# Patient Record
Sex: Female | Born: 1975 | Race: Black or African American | Hispanic: No | Marital: Married | Smoking: Never smoker
Health system: Southern US, Community
[De-identification: ages and names within clinical notes are randomized; demographics above are authoritative.]

## PROBLEM LIST (undated history)

## (undated) DIAGNOSIS — M329 Systemic lupus erythematosus, unspecified: Secondary | ICD-10-CM

## (undated) DIAGNOSIS — N921 Excessive and frequent menstruation with irregular cycle: Secondary | ICD-10-CM

## (undated) DIAGNOSIS — D510 Vitamin B12 deficiency anemia due to intrinsic factor deficiency: Secondary | ICD-10-CM

## (undated) DIAGNOSIS — K909 Intestinal malabsorption, unspecified: Secondary | ICD-10-CM

## (undated) DIAGNOSIS — F429 Obsessive-compulsive disorder, unspecified: Secondary | ICD-10-CM

## (undated) DIAGNOSIS — F419 Anxiety disorder, unspecified: Secondary | ICD-10-CM

## (undated) DIAGNOSIS — D500 Iron deficiency anemia secondary to blood loss (chronic): Secondary | ICD-10-CM

## (undated) HISTORY — DX: Obsessive-compulsive disorder, unspecified: F42.9

## (undated) HISTORY — DX: Intestinal malabsorption, unspecified: K90.9

## (undated) HISTORY — PX: KNEE SURGERY: SHX244

## (undated) HISTORY — DX: Excessive and frequent menstruation with irregular cycle: N92.1

## (undated) HISTORY — DX: Vitamin B12 deficiency anemia due to intrinsic factor deficiency: D51.0

## (undated) HISTORY — DX: Anxiety disorder, unspecified: F41.9

## (undated) HISTORY — DX: Iron deficiency anemia secondary to blood loss (chronic): D50.0

---

## 1997-12-12 ENCOUNTER — Emergency Department (HOSPITAL_COMMUNITY): Admission: EM | Admit: 1997-12-12 | Discharge: 1997-12-12 | Payer: Self-pay | Admitting: Emergency Medicine

## 1998-03-02 ENCOUNTER — Emergency Department (HOSPITAL_COMMUNITY): Admission: EM | Admit: 1998-03-02 | Discharge: 1998-03-02 | Payer: Self-pay | Admitting: Emergency Medicine

## 1998-08-13 ENCOUNTER — Emergency Department (HOSPITAL_COMMUNITY): Admission: EM | Admit: 1998-08-13 | Discharge: 1998-08-14 | Payer: Self-pay | Admitting: Emergency Medicine

## 1999-02-17 ENCOUNTER — Emergency Department (HOSPITAL_COMMUNITY): Admission: EM | Admit: 1999-02-17 | Discharge: 1999-02-17 | Payer: Self-pay | Admitting: Internal Medicine

## 1999-10-14 ENCOUNTER — Emergency Department (HOSPITAL_COMMUNITY): Admission: EM | Admit: 1999-10-14 | Discharge: 1999-10-14 | Payer: Self-pay | Admitting: Emergency Medicine

## 2003-01-02 ENCOUNTER — Emergency Department (HOSPITAL_COMMUNITY): Admission: EM | Admit: 2003-01-02 | Discharge: 2003-01-03 | Payer: Self-pay | Admitting: Emergency Medicine

## 2004-07-26 ENCOUNTER — Emergency Department (HOSPITAL_COMMUNITY): Admission: EM | Admit: 2004-07-26 | Discharge: 2004-07-26 | Payer: Self-pay | Admitting: Emergency Medicine

## 2017-02-03 ENCOUNTER — Emergency Department (HOSPITAL_COMMUNITY)
Admission: EM | Admit: 2017-02-03 | Discharge: 2017-02-04 | Disposition: A | Discharge status: Home / Self Care | Payer: Self-pay | Attending: Emergency Medicine | Admitting: Emergency Medicine

## 2017-02-03 ENCOUNTER — Encounter (HOSPITAL_COMMUNITY): Payer: Self-pay | Admitting: Emergency Medicine

## 2017-02-03 DIAGNOSIS — R110 Nausea: Secondary | ICD-10-CM | POA: Insufficient documentation

## 2017-02-03 DIAGNOSIS — R51 Headache: Secondary | ICD-10-CM | POA: Insufficient documentation

## 2017-02-03 DIAGNOSIS — K0889 Other specified disorders of teeth and supporting structures: Secondary | ICD-10-CM | POA: Insufficient documentation

## 2017-02-03 HISTORY — DX: Systemic lupus erythematosus, unspecified: M32.9

## 2017-02-03 MED ORDER — ONDANSETRON 4 MG PO TBDP: 4.0000 mg | ORAL_TABLET | Freq: Three times a day (TID) | ORAL | 0 refills | Status: DC | PRN

## 2017-02-03 MED ORDER — NAPROXEN 500 MG PO TABS: 500.0000 mg | ORAL_TABLET | Freq: Two times a day (BID) | ORAL | 0 refills | Status: DC

## 2017-02-03 MED ORDER — OXYCODONE-ACETAMINOPHEN 5-325 MG PO TABS
1.0000 | ORAL_TABLET | Freq: Once | ORAL | Status: AC
  Administered 2017-02-03: 1 via ORAL
  Filled 2017-02-03: qty 1

## 2017-02-03 MED ORDER — PENICILLIN V POTASSIUM 500 MG PO TABS: 500.0000 mg | ORAL_TABLET | Freq: Four times a day (QID) | ORAL | 0 refills | Status: AC

## 2017-02-03 MED ORDER — NAPROXEN 500 MG PO TABS
500.0000 mg | ORAL_TABLET | Freq: Once | ORAL | Status: AC
  Administered 2017-02-03: 500 mg via ORAL
  Filled 2017-02-03: qty 1

## 2017-02-03 MED ORDER — ONDANSETRON 4 MG PO TBDP
4.0000 mg | ORAL_TABLET | Freq: Once | ORAL | Status: AC
Start: 2017-02-03 — End: 2017-02-03
  Administered 2017-02-03: 4 mg via ORAL
  Filled 2017-02-03: qty 1

## 2017-02-03 NOTE — ED Triage Notes (Signed)
Pt states that she has had L upper dental pain since Monday. Alert and oriented.

## 2017-02-03 NOTE — Discharge Instructions (Signed)
Take naproxen twice daily for pain. Start antibiotics immediately. Follow-up with dentistry for formal evaluation. If you have difficulty swallowing, increasing facial swelling or any new or worsening symptoms you should be reevaluated.

## 2017-02-03 NOTE — ED Provider Notes (Signed)
[facility]-EMERGENCY DEPT Provider Note   CSN: [id number] Arrival date & time: 02/03/17  1845  By signing my name below, I, Diona BrownerJennifer Gorman, attest that this documentation has been prepared under the direction and in the presence of Horton, Mayer Maskerourtney F, MD. Electronically Signed: Diona BrownerJennifer Gorman, ED Scribe. 02/03/17. 11:30 PM.  History   Chief Complaint Chief Complaint  Patient presents with  . Dental Pain    HPI Paula Sherman is a 41 y.o. female who presents to the Emergency Department complaining of gradually worsening, severe, left upper dental pain since Monday, 02/01/17. Associated sx include nausea, left sided HA. She has taken ibuprofen and tension HA medication with mild to no relief. Had dental infection in the past. No allergies to medication. Pt denies fever, vomiting, or any other sx at this time.Denies difficulty swallowing or recent dental procedure.  The history is provided by the patient. No language interpreter was used.    Past Medical History:  Diagnosis Date  . Lupus     There are no active problems to display for this patient.   Past Surgical History:  Procedure Laterality Date  . KNEE SURGERY Right     OB History    No data available       Home Medications    Prior to Admission medications   Medication Sig Start Date End Date Taking? Authorizing Provider  naproxen (NAPROSYN) 500 MG tablet Take 1 tablet (500 mg total) by mouth 2 (two) times daily. 02/03/17   Horton, Mayer Maskerourtney F, MD  ondansetron (ZOFRAN ODT) 4 MG disintegrating tablet Take 1 tablet (4 mg total) by mouth every 8 (eight) hours as needed for nausea or vomiting. 02/03/17   Horton, Mayer Maskerourtney F, MD  penicillin v potassium (VEETID) 500 MG tablet Take 1 tablet (500 mg total) by mouth 4 (four) times daily. 02/03/17 02/10/17  Shon BatonHorton, Courtney F, MD    Family History No family history on file.  Social History Social History  Substance Use Topics  . Smoking status: Not on file  . Smokeless  tobacco: Not on file  . Alcohol use Not on file     Allergies   Patient has no known allergies.   Review of Systems Review of Systems  Constitutional: Negative for fever.  HENT: Positive for dental problem.   Gastrointestinal: Positive for nausea. Negative for vomiting.  Musculoskeletal: Positive for neck pain.  Neurological: Positive for headaches.  All other systems reviewed and are negative.    Physical Exam Updated Vital Signs BP (!) 170/121 (BP Location: Left Arm)   Pulse (!) 102   Temp 97.8 F (36.6 C) (Oral)   Resp 18   LMP 01/27/2017   SpO2 92%   Physical Exam  Constitutional: She is oriented to person, place, and time. She appears well-developed and well-nourished.  Uncomfortable appearing, no acute distress  HENT:  Head: Normocephalic and atraumatic.  No trismus, tenderness to palpation along the gumline on the left upper premolar, no palpable abscess, no significant facial swelling, no fullness noted under the tongue  Eyes: Pupils are equal, round, and reactive to light.  Neck: Neck supple.  Cardiovascular: Normal rate, regular rhythm and normal heart sounds.   No murmur heard. Pulmonary/Chest: Effort normal and breath sounds normal. No respiratory distress. She has no wheezes.  Lymphadenopathy:    She has no cervical adenopathy.  Neurological: She is alert and oriented to person, place, and time.  Skin: Skin is warm and dry.  Psychiatric: She has a normal mood and  affect.  Nursing note and vitals reviewed.    ED Treatments / Results  DIAGNOSTIC STUDIES: Oxygen Saturation is 92% on RA, low by my interpretation.   COORDINATION OF CARE: 11:30 PM-Discussed next steps with pt which includes taking pain medication, antibiotics, nausea medication and following up with a Dentist. Pt verbalized understanding and is agreeable with the plan.   Labs (all labs ordered are listed, but only abnormal results are displayed) Labs Reviewed - No data to  display  EKG  EKG Interpretation None       Radiology No results found.  Procedures Procedures (including critical care time)  Medications Ordered in ED Medications  ondansetron (ZOFRAN-ODT) disintegrating tablet 4 mg (not administered)  oxyCODONE-acetaminophen (PERCOCET/ROXICET) 5-325 MG per tablet 1 tablet (not administered)  naproxen (NAPROSYN) tablet 500 mg (not administered)     Initial Impression / Assessment and Plan / ED Course  I have reviewed the triage vital signs and the nursing notes.  Pertinent labs & imaging results that were available during my care of the patient were reviewed by me and considered in my medical decision making (see chart for details).     Patient presents with left sided dental pain and headache. Exam is fairly unremarkable. No drainable abscess.  She has no signs of complications or Lud[place]igs.  Discussed with patient symptom control. She was given Percocet, naproxen, and Zofran here. She'll be discharged with naproxen and penicillin. Dental resources provided. She was instructed that she will need to see dentistry for definitive care.  After history, exam, and medical workup I feel the patient has been appropriately medically screened and is safe for discharge home. Pertinent diagnoses were discussed with the patient. Patient was given return precautions.   Final Clinical Impressions(s) / ED Diagnoses   Final diagnoses:  Pain, dental    New Prescriptions New Prescriptions   NAPROXEN (NAPROSYN) 500 MG TABLET    Take 1 tablet (500 mg total) by mouth 2 (two) times daily.   ONDANSETRON (ZOFRAN ODT) 4 MG DISINTEGRATING TABLET    Take 1 tablet (4 mg total) by mouth every 8 (eight) hours as needed for nausea or vomiting.   PENICILLIN V POTASSIUM (VEETID) 500 MG TABLET    Take 1 tablet (500 mg total) by mouth 4 (four) times daily.   I personally performed the services described in this documentation, which was scribed in my presence. The  recorded information has been reviewed and is accurate.     Shon BatonHorton, Courtney F, MD 02/03/17 [phone]

## 2017-02-04 NOTE — ED Notes (Signed)
Pt ambulatory and independent at discharge.  Verbalized understanding of discharge instructions 

## 2017-10-25 ENCOUNTER — Emergency Department (HOSPITAL_COMMUNITY)
Admission: EM | Admit: 2017-10-25 | Discharge: 2017-10-26 | Disposition: A | Discharge status: Home / Self Care | Payer: Self-pay | Attending: Emergency Medicine | Admitting: Emergency Medicine

## 2017-10-25 ENCOUNTER — Encounter (HOSPITAL_COMMUNITY): Payer: Self-pay | Admitting: Emergency Medicine

## 2017-10-25 ENCOUNTER — Other Ambulatory Visit: Payer: Self-pay

## 2017-10-25 DIAGNOSIS — R1084 Generalized abdominal pain: Secondary | ICD-10-CM

## 2017-10-25 DIAGNOSIS — K529 Noninfective gastroenteritis and colitis, unspecified: Secondary | ICD-10-CM | POA: Insufficient documentation

## 2017-10-25 DIAGNOSIS — R110 Nausea: Secondary | ICD-10-CM

## 2017-10-25 LAB — CBC
HEMATOCRIT: 30.9 % — AB (ref 36.0–46.0)
Hemoglobin: 8.3 g/dL — ABNORMAL LOW (ref 12.0–15.0)
MCH: 16.9 pg — AB (ref 26.0–34.0)
MCHC: 26.9 g/dL — ABNORMAL LOW (ref 30.0–36.0)
MCV: 63.1 fL — AB (ref 78.0–100.0)
PLATELETS: 491 10*3/uL — AB (ref 150–400)
RBC: 4.9 MIL/uL (ref 3.87–5.11)
RDW: 17.5 % — ABNORMAL HIGH (ref 11.5–15.5)
WBC: 6.5 10*3/uL (ref 4.0–10.5)

## 2017-10-25 LAB — COMPREHENSIVE METABOLIC PANEL
ALT: 16 U/L (ref 14–54)
AST: 20 U/L (ref 15–41)
Albumin: 3.9 g/dL (ref 3.5–5.0)
Alkaline Phosphatase: 68 U/L (ref 38–126)
Anion gap: 9 (ref 5–15)
BUN: 7 mg/dL (ref 6–20)
CHLORIDE: 105 mmol/L (ref 101–111)
CO2: 20 mmol/L — AB (ref 22–32)
CREATININE: 0.71 mg/dL (ref 0.44–1.00)
Calcium: 9.1 mg/dL (ref 8.9–10.3)
GFR calc non Af Amer: >60 mL/min (ref >60)
Glucose, Bld: 134 mg/dL — ABNORMAL HIGH (ref 65–99)
POTASSIUM: 3.8 mmol/L (ref 3.5–5.1)
SODIUM: 134 mmol/L — AB (ref 135–145)
Total Bilirubin: 0.9 mg/dL (ref 0.3–1.2)
Total Protein: 8.9 g/dL — ABNORMAL HIGH (ref 6.5–8.1)

## 2017-10-25 LAB — LIPASE, BLOOD: LIPASE: 26 U/L (ref 11–51)

## 2017-10-25 LAB — I-STAT BETA HCG BLOOD, ED (MC, WL, AP ONLY): I-stat hCG, quantitative: <5 m[IU]/mL (ref <5)

## 2017-10-25 NOTE — ED Triage Notes (Addendum)
Pt from home via ems with c/o lower abdominal pain and cramping that radiates up her back. Pt is sensitive to palpation mostly on rt side. LMP ws March 17. Pt denies being sexually active. Pt reports smaller than normal bm x 2 days. Pt reports nausea with no emesis to EMS but reports 4 episodes of emesis to this RN.

## 2017-10-25 NOTE — ED Notes (Signed)
PT BEGAN C/O MID STERNAL CHESTPAIN DURING BLOOD DRAW RN NOTIFIED EKG WILL BE PERFORMED

## 2017-10-26 ENCOUNTER — Encounter (HOSPITAL_COMMUNITY): Payer: Self-pay

## 2017-10-26 ENCOUNTER — Other Ambulatory Visit: Payer: Self-pay

## 2017-10-26 ENCOUNTER — Emergency Department (HOSPITAL_COMMUNITY): Payer: Self-pay

## 2017-10-26 LAB — URINALYSIS, ROUTINE W REFLEX MICROSCOPIC
Bilirubin Urine: NEGATIVE
Glucose, UA: NEGATIVE mg/dL
Ketones, ur: 20 mg/dL — AB
Leukocytes, UA: NEGATIVE
Nitrite: POSITIVE — AB
PH: 5 (ref 5.0–8.0)
Protein, ur: 30 mg/dL — AB
SPECIFIC GRAVITY, URINE: 1.033 — AB (ref 1.005–1.030)

## 2017-10-26 MED ORDER — FENTANYL CITRATE (PF) 100 MCG/2ML IJ SOLN
50.0000 ug | Freq: Once | INTRAMUSCULAR | Status: AC
  Administered 2017-10-26: 50 ug via INTRAVENOUS
  Filled 2017-10-26: qty 2

## 2017-10-26 MED ORDER — ONDANSETRON HCL 4 MG/2ML IJ SOLN
4.0000 mg | Freq: Once | INTRAMUSCULAR | Status: AC | PRN
  Administered 2017-10-26: 4 mg via INTRAVENOUS
  Filled 2017-10-26: qty 2

## 2017-10-26 MED ORDER — DICYCLOMINE HCL 20 MG PO TABS: 20.0000 mg | ORAL_TABLET | Freq: Two times a day (BID) | ORAL | 0 refills | Status: DC

## 2017-10-26 MED ORDER — MORPHINE SULFATE (PF) 4 MG/ML IV SOLN
4.0000 mg | Freq: Once | INTRAVENOUS | Status: AC
  Administered 2017-10-26: 4 mg via INTRAVENOUS
  Filled 2017-10-26: qty 1

## 2017-10-26 MED ORDER — METOCLOPRAMIDE HCL 10 MG PO TABS: 10.0000 mg | ORAL_TABLET | Freq: Four times a day (QID) | ORAL | 0 refills | Status: DC

## 2017-10-26 MED ORDER — IOPAMIDOL (ISOVUE-300) INJECTION 61%
INTRAVENOUS | Status: AC
  Filled 2017-10-26: qty 100

## 2017-10-26 MED ORDER — IOPAMIDOL (ISOVUE-300) INJECTION 61%
100.0000 mL | Freq: Once | INTRAVENOUS | Status: AC | PRN
  Administered 2017-10-26: 100 mL via INTRAVENOUS

## 2017-10-26 MED ORDER — METOCLOPRAMIDE HCL 5 MG/ML IJ SOLN
10.0000 mg | Freq: Once | INTRAMUSCULAR | Status: AC
  Administered 2017-10-26: 10 mg via INTRAVENOUS
  Filled 2017-10-26: qty 2

## 2017-10-26 NOTE — ED Provider Notes (Signed)
[facility]-EMERGENCY DEPT Provider Note   CSN: [id number] Arrival date & time: 10/25/17  2125     History   Chief Complaint Chief Complaint  Patient presents with  . Abdominal Pain    HPI Paula Sherman is a 42 y.o. female.  Patient presents for evaluation of abdominal pain associated with nausea. No vomiting. Pain is cramping, sharp, located in the suprapubic region and moves upward along midline to periumbilical area. There is radiation to the back. The pain prevents her from being able to sit straight up because this makes it worse. No fever. No urinary symptoms or diarrhea. She reports increased pain with need to have a BM. No blood per rectum. She reports she started having chest pain after arrival to the hospital. She tried taking Tylenol for pain without relief.   The history is provided by the patient and the spouse. No language interpreter was used.    Past Medical History:  Diagnosis Date  . Lupus (HCC)     There are no active problems to display for this patient.   Past Surgical History:  Procedure Laterality Date  . KNEE SURGERY Right      OB History   None      Home Medications    Prior to Admission medications   Medication Sig Start Date End Date Taking? Authorizing Provider  naproxen (NAPROSYN) 500 MG tablet Take 1 tablet (500 mg total) by mouth 2 (two) times daily. Patient not taking: Reported on 10/26/2017 02/03/17   Horton, Mayer Maskerourtney F, MD  ondansetron (ZOFRAN ODT) 4 MG disintegrating tablet Take 1 tablet (4 mg total) by mouth every 8 (eight) hours as needed for nausea or vomiting. Patient not taking: Reported on 10/26/2017 02/03/17   Horton, Mayer Maskerourtney F, MD    Family History No family history on file.  Social History Social History   Tobacco Use  . Smoking status: Unknown If Ever Smoked  Substance Use Topics  . Alcohol use: Not Currently    Frequency: Never  . Drug use: Not Currently     Allergies     Penicillins   Review of Systems Review of Systems  Constitutional: Negative for chills and fever.  HENT: Negative.   Respiratory: Negative.  Negative for shortness of breath.   Cardiovascular: Positive for chest pain (After arrival to hospital).  Gastrointestinal: Positive for abdominal pain and nausea. Negative for blood in stool and diarrhea.  Genitourinary: Negative.   Musculoskeletal: Negative.   Skin: Negative.   Neurological: Negative.      Physical Exam Updated Vital Signs BP (!) 154/90 (BP Location: Left Arm)   Pulse 100   Temp 98.1 F (36.7 C) (Oral)   Resp (!) 27   Ht 5\' 7"  (1.702 m)   Wt 90.7 kg (200 lb)   LMP 09/26/2017 Comment: negative beta HCG 10/25/17  SpO2 100%   BMI 31.32 kg/m   Physical Exam  Constitutional: She appears well-developed and well-nourished.  Uncomfortable appearing.  HENT:  Head: Normocephalic.  Neck: Normal range of motion. Neck supple.  Cardiovascular: Normal rate and regular rhythm.  No murmur heard. Pulmonary/Chest: Effort normal and breath sounds normal.  Abdominal: Soft. Bowel sounds are normal. There is tenderness (Diffuse tenderness that is worse in the suprapubic and periumbilical abdomen.) in the periumbilical area and suprapubic area. There is no rebound and no guarding.  Musculoskeletal: Normal range of motion.  Neurological: She is alert. No cranial nerve deficit.  Skin: Skin is warm and dry. No  rash noted.  Psychiatric: She has a normal mood and affect.     ED Treatments / Results  Labs (all labs ordered are listed, but only abnormal results are displayed) Labs Reviewed  COMPREHENSIVE METABOLIC PANEL - Abnormal; Notable for the following components:      Result Value   Sodium 134 (*)    CO2 20 (*)    Glucose, Bld 134 (*)    Total Protein 8.9 (*)    All other components within normal limits  CBC - Abnormal; Notable for the following components:   Hemoglobin 8.3 (*)    HCT 30.9 (*)    MCV 63.1 (*)    MCH 16.9  (*)    MCHC 26.9 (*)    RDW 17.5 (*)    Platelets 491 (*)    All other components within normal limits  LIPASE, BLOOD  URINALYSIS, ROUTINE W REFLEX MICROSCOPIC  I-STAT BETA HCG BLOOD, ED ([facility], [facility], [facility] ONLY)   Results for orders placed or performed during the hospital encounter of 10/25/17  Lipase, blood  Result Value Ref Range   Lipase 26 11 - 51 U/L  Comprehensive metabolic panel  Result Value Ref Range   Sodium 134 (L) 135 - 145 mmol/L   Potassium 3.8 3.5 - 5.1 mmol/L   Chloride 105 101 - 111 mmol/L   CO2 20 (L) 22 - 32 mmol/L   Glucose, Bld 134 (H) 65 - 99 mg/dL   BUN 7 6 - 20 mg/dL   Creatinine, Ser 1.61 0.44 - 1.00 mg/dL   Calcium 9.1 8.9 - 09.6 mg/dL   Total Protein 8.9 (H) 6.5 - 8.1 g/dL   Albumin 3.9 3.5 - 5.0 g/dL   AST 20 15 - 41 U/L   ALT 16 14 - 54 U/L   Alkaline Phosphatase 68 38 - 126 U/L   Total Bilirubin 0.9 0.3 - 1.2 mg/dL   GFR calc non Af Amer >60 >60 mL/min   GFR calc Af Amer >60 >60 mL/min   Anion gap 9 5 - 15  CBC  Result Value Ref Range   WBC 6.5 4.0 - 10.5 K/uL   RBC 4.90 3.87 - 5.11 MIL/uL   Hemoglobin 8.3 (L) 12.0 - 15.0 g/dL   HCT 04.5 (L) 40.9 - 81.1 %   MCV 63.1 (L) 78.0 - 100.0 fL   MCH 16.9 (L) 26.0 - 34.0 pg   MCHC 26.9 (L) 30.0 - 36.0 g/dL   RDW 91.4 (H) 78.2 - 95.6 %   Platelets 491 (H) 150 - 400 K/uL  Urinalysis, Routine w reflex microscopic  Result Value Ref Range   Color, Urine AMBER (A) YELLOW   APPearance CLEAR CLEAR   Specific Gravity, Urine 1.033 (H) 1.005 - 1.030   pH 5.0 5.0 - 8.0   Glucose, UA NEGATIVE NEGATIVE mg/dL   Hgb urine dipstick SMALL (A) NEGATIVE   Bilirubin Urine NEGATIVE NEGATIVE   Ketones, ur 20 (A) NEGATIVE mg/dL   Protein, ur 30 (A) NEGATIVE mg/dL   Nitrite POSITIVE (A) NEGATIVE   Leukocytes, UA NEGATIVE NEGATIVE   RBC / HPF 0-5 0 - 5 RBC/hpf   WBC, UA 0-5 0 - 5 WBC/hpf   Bacteria, UA MANY (A) NONE SEEN   Squamous Epithelial / LPF 0-5 (A) NONE SEEN   Mucus PRESENT   I-Stat beta hCG blood, ED  Result  Value Ref Range   I-stat hCG, quantitative <5.0 <5 mIU/mL   Comment 3  EKG None  Radiology No results found. Ct Abdomen Pelvis W Contrast  Result Date: 10/26/2017 CLINICAL DATA:  Acute onset of lower abdominal pain and nausea. EXAM: CT ABDOMEN AND PELVIS WITH CONTRAST TECHNIQUE: Multidetector CT imaging of the abdomen and pelvis was performed using the standard protocol following bolus administration of intravenous contrast. CONTRAST:  ISOVUE-300 IOPAMIDOL (ISOVUE-300) INJECTION 61% COMPARISON:  None. FINDINGS: Lower chest: Minimal scarring is noted at the left lung base. The visualized portions of the mediastinum are unremarkable. Hepatobiliary: The liver is unremarkable in appearance. The gallbladder is unremarkable. The common bile duct remains normal in caliber. Pancreas: The pancreas is within normal limits. Spleen: The spleen is unremarkable in appearance. Adrenals/Urinary Tract: The adrenal glands are unremarkable in appearance. The kidneys are within normal limits. There is no evidence of hydronephrosis. No renal or ureteral stones are identified. No perinephric stranding is seen. Stomach/Bowel: There is distention of small-bowel loops to 3.7 cm in diameter, with wall thickening along a relatively long segment of proximal to mid ileum at right mid abdomen, reflecting infectious or inflammatory ileitis. Mild underlying mesenteric edema is noted. There is no definite evidence for bowel obstruction. Vascular/Lymphatic: The abdominal aorta is unremarkable in appearance. The inferior vena cava is grossly unremarkable. No retroperitoneal lymphadenopathy is seen. No pelvic sidewall lymphadenopathy is identified. Reproductive: The bladder is mildly distended and grossly unremarkable. Several uterine fibroids are noted. The uterus is otherwise unremarkable. The ovaries are grossly symmetric. No suspicious masses are seen. Other: A small amount of ascites is noted tracking about the liver  and spleen, and along the left paracolic gutter. A small amount of free fluid is noted within the pelvis. Musculoskeletal: No acute osseous abnormalities are identified. The visualized musculature is unremarkable in appearance. IMPRESSION: 1. Distention of small-bowel loops to 3.7 cm in diameter, with wall thickening along a relatively long segment of proximal to mid ileum at the right mid abdomen, reflecting infectious or inflammatory ileitis with underlying dysmotility. No definite evidence for bowel obstruction. 2. Associated small amount of ascites within the abdomen and pelvis. 3. Uterine fibroids noted. Electronically Signed   By: Roanna Raider M.D.   On: 10/26/2017 05:46    Procedures Procedures (including critical care time)  Medications Ordered in ED Medications  iopamidol (ISOVUE-300) 61 % injection (has no administration in time range)  ondansetron (ZOFRAN) injection 4 mg (4 mg Intravenous Given 10/26/17 0217)  fentaNYL (SUBLIMAZE) injection 50 mcg (50 mcg Intravenous Given 10/26/17 0351)  morphine 4 MG/ML injection 4 mg (4 mg Intravenous Given 10/26/17 0439)  metoCLOPramide (REGLAN) injection 10 mg (10 mg Intravenous Given 10/26/17 0437)  iopamidol (ISOVUE-300) 61 % injection 100 mL (100 mLs Intravenous Contrast Given 10/26/17 0507)     Initial Impression / Assessment and Plan / ED Course  I have reviewed the triage vital signs and the nursing notes.  Pertinent labs & imaging results that were available during my care of the patient were reviewed by me and considered in my medical decision making (see chart for details).     Patient having lower abdominal to periumbilical abdominal pain, nausea. No fever.   She appears uncomfortable on arrival, better with pain medications. No vomiting. Labs are essentially unremarkable.   Hgb 8.3. Hemoccult card collected but is lost in system and no result obtainable. Discussed recollection with the patient who refuses. She denies melena or  hematemesis. She reports history of heavy periods but is not aware of history of anemia. No history of transfusion. She denies  urinary symptoms. UA shows bacteria but minimal WBC's. Doubt this is contributory.  CT scan shows bowel wall inflammation c/w ileitis. No SBO observed definitively. She reports passing stool and persistent flatulence. Doubt obstruction.   Results reviewed and discussed with Dr. Bebe Shaggy. No fever or leukocytosis. Can be treated supportively, without need for antibiotics. Stressed close follow up with any changing symptoms or new concerns.   Patient's pain is improved with medications. VSS. She is felt appropriate for discharge home.   Final Clinical Impressions(s) / ED Diagnoses   Final diagnoses:  None   1. Abdominal pain 2. Ileitis  ED Discharge Orders    None       Elpidio Anis, Cordelia Poche 10/28/17 [id number]    Zadie Rhine, MD 10/28/17 [phone]

## 2017-10-26 NOTE — ED Notes (Signed)
Patient transported to CT 

## 2017-10-26 NOTE — ED Notes (Signed)
Pt made aware that Urine Sample needed. Pt states she is unable to void. Will try again later.

## 2017-10-26 NOTE — ED Notes (Signed)
Pt reports that pain has mildly decreased and nausea has returned after having intermittent relief with initial dose of Zofran. Provider to be notified.

## 2017-10-26 NOTE — Discharge Instructions (Signed)
Return here with any worsening symptoms or new concern.

## 2018-09-07 ENCOUNTER — Encounter: Payer: Self-pay | Admitting: Family Medicine

## 2018-09-07 ENCOUNTER — Other Ambulatory Visit: Payer: Self-pay | Admitting: Family Medicine

## 2018-09-07 ENCOUNTER — Ambulatory Visit: Payer: 59 | Admitting: Family Medicine

## 2018-09-07 ENCOUNTER — Telehealth: Payer: Self-pay | Admitting: Family Medicine

## 2018-09-07 VITALS — BP 118/70 | HR 68 | Temp 98.0°F | Ht 67.5 in | Wt 194.0 lb

## 2018-09-07 DIAGNOSIS — Z114 Encounter for screening for human immunodeficiency virus [HIV]: Secondary | ICD-10-CM

## 2018-09-07 DIAGNOSIS — Z13220 Encounter for screening for lipoid disorders: Secondary | ICD-10-CM | POA: Diagnosis not present

## 2018-09-07 DIAGNOSIS — Z131 Encounter for screening for diabetes mellitus: Secondary | ICD-10-CM | POA: Diagnosis not present

## 2018-09-07 DIAGNOSIS — N920 Excessive and frequent menstruation with regular cycle: Secondary | ICD-10-CM

## 2018-09-07 DIAGNOSIS — Z1211 Encounter for screening for malignant neoplasm of colon: Secondary | ICD-10-CM

## 2018-09-07 DIAGNOSIS — D649 Anemia, unspecified: Secondary | ICD-10-CM

## 2018-09-07 DIAGNOSIS — M2550 Pain in unspecified joint: Secondary | ICD-10-CM | POA: Diagnosis not present

## 2018-09-07 DIAGNOSIS — R5383 Other fatigue: Secondary | ICD-10-CM | POA: Diagnosis not present

## 2018-09-07 DIAGNOSIS — R059 Cough, unspecified: Secondary | ICD-10-CM

## 2018-09-07 DIAGNOSIS — Z800 Family history of malignant neoplasm of digestive organs: Secondary | ICD-10-CM

## 2018-09-07 DIAGNOSIS — Z1239 Encounter for other screening for malignant neoplasm of breast: Secondary | ICD-10-CM

## 2018-09-07 DIAGNOSIS — M329 Systemic lupus erythematosus, unspecified: Secondary | ICD-10-CM | POA: Diagnosis not present

## 2018-09-07 DIAGNOSIS — R05 Cough: Secondary | ICD-10-CM

## 2018-09-07 LAB — CBC WITH DIFFERENTIAL/PLATELET
Basophils Absolute: 0 10*3/uL (ref 0.0–0.1)
Basophils Relative: 0.8 % (ref 0.0–3.0)
Eosinophils Absolute: 0.1 10*3/uL (ref 0.0–0.7)
Eosinophils Relative: 2.2 % (ref 0.0–5.0)
HCT: 25.5 % — ABNORMAL LOW (ref 36.0–46.0)
Hemoglobin: 7.5 g/dL — CL (ref 12.0–15.0)
Lymphocytes Relative: 44.3 % (ref 12.0–46.0)
Lymphs Abs: 1.2 10*3/uL (ref 0.7–4.0)
MCHC: 29.2 g/dL — AB (ref 30.0–36.0)
MCV: 59.8 fl — ABNORMAL LOW (ref 78.0–100.0)
Monocytes Absolute: 0.3 10*3/uL (ref 0.1–1.0)
Monocytes Relative: 10.1 % (ref 3.0–12.0)
Neutro Abs: 1.2 10*3/uL — ABNORMAL LOW (ref 1.4–7.7)
Neutrophils Relative %: 42.6 % — ABNORMAL LOW (ref 43.0–77.0)
Platelets: 420 10*3/uL — ABNORMAL HIGH (ref 150.0–400.0)
RBC: 4.27 Mil/uL (ref 3.87–5.11)
RDW: 18.6 % — ABNORMAL HIGH (ref 11.5–15.5)
WBC: 2.7 10*3/uL — ABNORMAL LOW (ref 4.0–10.5)

## 2018-09-07 LAB — COMPREHENSIVE METABOLIC PANEL
ALT: 8 U/L (ref 0–35)
AST: 16 U/L (ref 0–37)
Albumin: 4.2 g/dL (ref 3.5–5.2)
Alkaline Phosphatase: 74 U/L (ref 39–117)
BUN: 7 mg/dL (ref 6–23)
CO2: 25 mEq/L (ref 19–32)
Calcium: 9 mg/dL (ref 8.4–10.5)
Chloride: 102 mEq/L (ref 96–112)
Creatinine, Ser: 0.83 mg/dL (ref 0.40–1.20)
GFR: 90.25 mL/min (ref >60.00)
GLUCOSE: 81 mg/dL (ref 70–99)
Potassium: 3.4 mEq/L — ABNORMAL LOW (ref 3.5–5.1)
Sodium: 137 mEq/L (ref 135–145)
Total Bilirubin: 0.9 mg/dL (ref 0.2–1.2)
Total Protein: 8.2 g/dL (ref 6.0–8.3)

## 2018-09-07 LAB — LIPID PANEL
Cholesterol: 170 mg/dL (ref 0–200)
HDL: 34.7 mg/dL — ABNORMAL LOW (ref >39.00)
LDL Cholesterol: 120 mg/dL — ABNORMAL HIGH (ref 0–99)
NonHDL: 135.57
Total CHOL/HDL Ratio: 5
Triglycerides: 78 mg/dL (ref 0.0–149.0)
VLDL: 15.6 mg/dL (ref 0.0–40.0)

## 2018-09-07 LAB — C-REACTIVE PROTEIN

## 2018-09-07 LAB — SEDIMENTATION RATE: Sed Rate: 122 mm/hr — ABNORMAL HIGH (ref 0–20)
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
## 2018-09-07 NOTE — Telephone Encounter (Signed)
I received a fasx regarding critical lab results.  *hemoglobin 7.5*  Long standing hx of anemia per patient. States that she had some evaluation over 20 years ago. There was question of sickle cell at that time? States she didn't get definite answer.   Periods are "awful". Has heavy bleeding first 2-3 days with clots. They are regular. Have been this way for years.   She denies dizziness, shortness of breath, fatigue.  He is okay with referrals to hematology for her significant anemia which I had suggested as well as gynecology to discuss her heavy periods.   I will see if we are able to add on some iron studies to blood work from today.

## 2018-09-08 ENCOUNTER — Other Ambulatory Visit (INDEPENDENT_AMBULATORY_CARE_PROVIDER_SITE_OTHER): Payer: 59

## 2018-09-08 ENCOUNTER — Encounter: Payer: Self-pay | Admitting: Family Medicine

## 2018-09-08 DIAGNOSIS — D649 Anemia, unspecified: Secondary | ICD-10-CM | POA: Diagnosis not present

## 2018-09-08 LAB — IBC + FERRITIN
FERRITIN: 3.5 ng/mL — AB (ref 10.0–291.0)
Iron: 16 ug/dL — ABNORMAL LOW (ref 42–145)
Saturation Ratios: 3.9 % — ABNORMAL LOW (ref 20.0–50.0)
Transferrin: 296 mg/dL (ref 212.0–360.0)

## 2018-09-08 LAB — TSH: TSH: 1.51 u[IU]/mL (ref 0.35–4.50)

## 2018-09-08 NOTE — Telephone Encounter (Signed)
Lab has been added. 

## 2018-09-09 ENCOUNTER — Telehealth: Payer: Self-pay | Admitting: Hematology & Oncology

## 2018-09-09 LAB — HIV ANTIBODY (ROUTINE TESTING W REFLEX): HIV: NONREACTIVE

## 2018-09-09 LAB — ANA: Anti Nuclear Antibody(ANA): POSITIVE — AB

## 2018-09-09 LAB — ANTI-NUCLEAR AB-TITER (ANA TITER): ANA Titer 1: 1:160 {titer} — ABNORMAL HIGH

## 2018-09-09 LAB — RHEUMATOID FACTOR: Rhuematoid fact SerPl-aCnc: <14 IU/mL (ref <14)

## 2018-09-09 NOTE — Telephone Encounter (Signed)
Spoke with patient to confirm new patient appt 3/2 at 130 pm. Pt preferred Dr Myna Hidalgo bc he treated her mom.

## 2018-09-12 ENCOUNTER — Encounter: Payer: Self-pay | Admitting: Hematology & Oncology

## 2018-09-12 ENCOUNTER — Inpatient Hospital Stay: Payer: 59

## 2018-09-12 ENCOUNTER — Other Ambulatory Visit: Payer: Self-pay

## 2018-09-12 ENCOUNTER — Inpatient Hospital Stay: Payer: 59 | Attending: Hematology & Oncology | Admitting: Hematology & Oncology

## 2018-09-12 VITALS — BP 144/87 | HR 94 | Temp 98.4°F | Resp 18 | Wt 196.5 lb

## 2018-09-12 DIAGNOSIS — D510 Vitamin B12 deficiency anemia due to intrinsic factor deficiency: Secondary | ICD-10-CM

## 2018-09-12 DIAGNOSIS — K909 Intestinal malabsorption, unspecified: Secondary | ICD-10-CM | POA: Insufficient documentation

## 2018-09-12 DIAGNOSIS — D500 Iron deficiency anemia secondary to blood loss (chronic): Secondary | ICD-10-CM

## 2018-09-12 DIAGNOSIS — N921 Excessive and frequent menstruation with irregular cycle: Secondary | ICD-10-CM | POA: Diagnosis not present

## 2018-09-12 DIAGNOSIS — N951 Menopausal and female climacteric states: Secondary | ICD-10-CM | POA: Insufficient documentation

## 2018-09-12 DIAGNOSIS — R61 Generalized hyperhidrosis: Secondary | ICD-10-CM | POA: Insufficient documentation

## 2018-09-12 DIAGNOSIS — E538 Deficiency of other specified B group vitamins: Secondary | ICD-10-CM | POA: Insufficient documentation

## 2018-09-12 DIAGNOSIS — D509 Iron deficiency anemia, unspecified: Secondary | ICD-10-CM | POA: Diagnosis present

## 2018-09-12 HISTORY — DX: Iron deficiency anemia secondary to blood loss (chronic): D50.0

## 2018-09-12 HISTORY — DX: Intestinal malabsorption, unspecified: K90.9

## 2018-09-12 HISTORY — DX: Vitamin B12 deficiency anemia due to intrinsic factor deficiency: D51.0

## 2018-09-12 HISTORY — DX: Excessive and frequent menstruation with irregular cycle: N92.1

## 2018-09-12 LAB — CMP (CANCER CENTER ONLY)
ALT: 7 U/L (ref 0–44)
AST: 14 U/L — ABNORMAL LOW (ref 15–41)
Albumin: 4.3 g/dL (ref 3.5–5.0)
Alkaline Phosphatase: 65 U/L (ref 38–126)
Anion gap: 9 (ref 5–15)
BUN: 9 mg/dL (ref 6–20)
CO2: 26 mmol/L (ref 22–32)
Calcium: 9.5 mg/dL (ref 8.9–10.3)
Chloride: 100 mmol/L (ref 98–111)
Creatinine: 0.81 mg/dL (ref 0.44–1.00)
GFR, Est AFR Am: >60 mL/min (ref >60)
GFR, Estimated: >60 mL/min (ref >60)
Glucose, Bld: 145 mg/dL — ABNORMAL HIGH (ref 70–99)
Potassium: 3.1 mmol/L — ABNORMAL LOW (ref 3.5–5.1)
Sodium: 135 mmol/L (ref 135–145)
Total Bilirubin: 0.8 mg/dL (ref 0.3–1.2)
Total Protein: 8.4 g/dL — ABNORMAL HIGH (ref 6.5–8.1)

## 2018-09-12 LAB — CBC WITH DIFFERENTIAL (CANCER CENTER ONLY)
Abs Immature Granulocytes: 0.01 10*3/uL (ref 0.00–0.07)
Basophils Absolute: 0 10*3/uL (ref 0.0–0.1)
Basophils Relative: 1 %
Eosinophils Absolute: 0.1 10*3/uL (ref 0.0–0.5)
Eosinophils Relative: 2 %
HCT: 27.2 % — ABNORMAL LOW (ref 36.0–46.0)
HEMOGLOBIN: 7.3 g/dL — AB (ref 12.0–15.0)
Immature Granulocytes: 0 %
Lymphocytes Relative: 46 %
Lymphs Abs: 1.5 10*3/uL (ref 0.7–4.0)
MCH: 17.5 pg — ABNORMAL LOW (ref 26.0–34.0)
MCHC: 26.8 g/dL — ABNORMAL LOW (ref 30.0–36.0)
MCV: 65.4 fL — ABNORMAL LOW (ref 80.0–100.0)
MONOS PCT: 7 %
Monocytes Absolute: 0.2 10*3/uL (ref 0.1–1.0)
Neutro Abs: 1.4 10*3/uL — ABNORMAL LOW (ref 1.7–7.7)
Neutrophils Relative %: 44 %
Platelet Count: 420 10*3/uL — ABNORMAL HIGH (ref 150–400)
RBC: 4.16 MIL/uL (ref 3.87–5.11)
RDW: 17.9 % — AB (ref 11.5–15.5)
WBC Count: 3.1 10*3/uL — ABNORMAL LOW (ref 4.0–10.5)
nRBC: 0 % (ref 0.0–0.2)

## 2018-09-12 LAB — RETICULOCYTES
Immature Retic Fract: 18.9 % — ABNORMAL HIGH (ref 2.3–15.9)
RBC.: 4.16 MIL/uL (ref 3.87–5.11)
Retic Count, Absolute: 34.5 10*3/uL (ref 19.0–186.0)
Retic Ct Pct: 0.8 % (ref 0.4–3.1)

## 2018-09-12 LAB — SAVE SMEAR(SSMR), FOR PROVIDER SLIDE REVIEW

## 2018-09-12 LAB — VITAMIN B12: VITAMIN B 12: 108 pg/mL — AB (ref 180–914)

## 2018-09-12 MED ORDER — FOLIC ACID 1 MG PO TABS: 2.0000 mg | ORAL_TABLET | Freq: Every day | ORAL | 3 refills | Status: DC
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
## 2018-09-12 NOTE — Addendum Note (Signed)
Addended by: Arlan Organ R on: 09/12/2018 05:30 PM   Modules accepted: Orders

## 2018-09-13 ENCOUNTER — Telehealth: Payer: Self-pay | Admitting: *Deleted

## 2018-09-13 LAB — IRON AND TIBC
Iron: 13 ug/dL — ABNORMAL LOW (ref 41–142)
Saturation Ratios: 4 % — ABNORMAL LOW (ref 21–57)
TIBC: 358 ug/dL (ref 236–444)
UIBC: 345 ug/dL (ref 120–384)

## 2018-09-13 LAB — FERRITIN

## 2018-09-13 LAB — LACTATE DEHYDROGENASE: LDH: 183 U/L (ref 98–192)

## 2018-09-13 LAB — ERYTHROPOIETIN: Erythropoietin: 209.4 m[IU]/mL — ABNORMAL HIGH (ref 2.6–18.5)

## 2018-09-13 NOTE — Telephone Encounter (Signed)
-----   Message from Josph Macho, MD sent at 09/13/2018 10:56 AM EST ----- Please call and tell her that her iron level is very low.  In addition, her vitamin B12 level is also very low.  She will need both IV iron and B12 injections.  I think she is set up to start this week.

## 2018-09-13 NOTE — Telephone Encounter (Signed)
Patient notified per order of Dr. Myna Hidalgo that her iron and vitamin b12 levels are low and that she will need both IV iron and B12 injections at her next two appts.  Pt appreciative of call and has no questions at this time.

## 2018-09-14 ENCOUNTER — Telehealth: Payer: Self-pay | Admitting: Hematology & Oncology

## 2018-09-14 NOTE — Telephone Encounter (Signed)
Called and talked with patient regarding appointments being added per 3/5 staff message

## 2018-09-15 LAB — HEMOGLOBINOPATHY EVALUATION
Hgb A2 Quant: 1.8 % (ref 1.8–3.2)
Hgb A: 98.2 % (ref 96.4–98.8)
Hgb C: 0 %
Hgb F Quant: 0 % (ref 0.0–2.0)
Hgb S Quant: 0 %
Hgb Variant: 0 %

## 2018-09-16 ENCOUNTER — Other Ambulatory Visit: Payer: Self-pay

## 2018-09-16 ENCOUNTER — Inpatient Hospital Stay: Payer: 59

## 2018-09-16 VITALS — BP 158/96 | HR 100 | Temp 98.2°F | Resp 18

## 2018-09-16 DIAGNOSIS — N921 Excessive and frequent menstruation with irregular cycle: Secondary | ICD-10-CM

## 2018-09-16 DIAGNOSIS — D509 Iron deficiency anemia, unspecified: Secondary | ICD-10-CM | POA: Diagnosis not present

## 2018-09-16 DIAGNOSIS — D500 Iron deficiency anemia secondary to blood loss (chronic): Secondary | ICD-10-CM

## 2018-09-16 DIAGNOSIS — K909 Intestinal malabsorption, unspecified: Secondary | ICD-10-CM

## 2018-09-16 MED ORDER — SODIUM CHLORIDE 0.9 % IV SOLN
Freq: Once | INTRAVENOUS | Status: AC
  Administered 2018-09-16: 11:00:00 via INTRAVENOUS
  Filled 2018-09-16: qty 250

## 2018-09-16 MED ORDER — SODIUM CHLORIDE 0.9 % IV SOLN
510.0000 mg | Freq: Once | INTRAVENOUS | Status: AC
  Administered 2018-09-16: 510 mg via INTRAVENOUS
  Filled 2018-09-16: qty 17

## 2018-09-16 NOTE — Patient Instructions (Signed)

## 2018-09-19 ENCOUNTER — Inpatient Hospital Stay: Payer: 59

## 2018-09-19 ENCOUNTER — Telehealth: Payer: Self-pay | Admitting: Hematology & Oncology

## 2018-09-19 VITALS — BP 156/97 | HR 90 | Temp 99.0°F | Resp 18

## 2018-09-19 DIAGNOSIS — D509 Iron deficiency anemia, unspecified: Secondary | ICD-10-CM | POA: Diagnosis not present

## 2018-09-19 DIAGNOSIS — N921 Excessive and frequent menstruation with irregular cycle: Secondary | ICD-10-CM

## 2018-09-19 DIAGNOSIS — K909 Intestinal malabsorption, unspecified: Secondary | ICD-10-CM

## 2018-09-19 DIAGNOSIS — D500 Iron deficiency anemia secondary to blood loss (chronic): Secondary | ICD-10-CM

## 2018-09-19 MED ORDER — CYANOCOBALAMIN 1000 MCG/ML IJ SOLN
INTRAMUSCULAR | Status: AC
  Filled 2018-09-19: qty 1

## 2018-09-19 MED ORDER — CYANOCOBALAMIN 1000 MCG/ML IJ SOLN
1000.0000 ug | Freq: Once | INTRAMUSCULAR | Status: AC
  Administered 2018-09-19: 1000 ug via INTRAMUSCULAR

## 2018-09-19 NOTE — Patient Instructions (Signed)
Cyanocobalamin, Vitamin B12 injection What is this medicine? CYANOCOBALAMIN (sye an oh koe BAL a min) is a man made form of vitamin B12. Vitamin B12 is used in the growth of healthy blood cells, nerve cells, and proteins in the body. It also helps with the metabolism of fats and carbohydrates. This medicine is used to treat people who can not absorb vitamin B12. This medicine may be used for other purposes; ask your health care provider or pharmacist if you have questions. COMMON BRAND NAME(S): B-12 Compliance Kit, B-12 Injection Kit, Cyomin, LA-12, Nutri-Twelve, Physicians EZ Use B-12, Primabalt What should I tell my health care provider before I take this medicine? They need to know if you have any of these conditions: -kidney disease -Leber's disease -megaloblastic anemia -an unusual or allergic reaction to cyanocobalamin, cobalt, other medicines, foods, dyes, or preservatives -pregnant or trying to get pregnant -breast-feeding How should I use this medicine? This medicine is injected into a muscle or deeply under the skin. It is usually given by a health care professional in a clinic or doctor's office. However, your doctor may teach you how to inject yourself. Follow all instructions. Talk to your pediatrician regarding the use of this medicine in children. Special care may be needed. Overdosage: If you think you have taken too much of this medicine contact a poison control center or emergency room at once. NOTE: This medicine is only for you. Do not share this medicine with others. What if I miss a dose? If you are given your dose at a clinic or doctor's office, call to reschedule your appointment. If you give your own injections and you miss a dose, take it as soon as you can. If it is almost time for your next dose, take only that dose. Do not take double or extra doses. What may interact with this medicine? -colchicine -heavy alcohol intake This list may not describe all possible  interactions. Give your health care provider a list of all the medicines, herbs, non-prescription drugs, or dietary supplements you use. Also tell them if you smoke, drink alcohol, or use illegal drugs. Some items may interact with your medicine. What should I watch for while using this medicine? Visit your doctor or health care professional regularly. You may need blood work done while you are taking this medicine. You may need to follow a special diet. Talk to your doctor. Limit your alcohol intake and avoid smoking to get the best benefit. What side effects may I notice from receiving this medicine? Side effects that you should report to your doctor or health care professional as soon as possible: -allergic reactions like skin rash, itching or hives, swelling of the face, lips, or tongue -blue tint to skin -chest tightness, pain -difficulty breathing, wheezing -dizziness -red, swollen painful area on the leg Side effects that usually do not require medical attention (report to your doctor or health care professional if they continue or are bothersome): -diarrhea -headache This list may not describe all possible side effects. Call your doctor for medical advice about side effects. You may report side effects to FDA at 1-800-FDA-1088. Where should I keep my medicine? Keep out of the reach of children. Store at room temperature between 15 and 30 degrees C (59 and 85 degrees F). Protect from light. Throw away any unused medicine after the expiration date. NOTE: This sheet is a summary. It may not cover all possible information. If you have questions about this medicine, talk to your doctor, pharmacist, or   health care provider.  2019 Elsevier/Gold Standard (2007-10-10 22:10:20)  

## 2018-09-19 NOTE — Telephone Encounter (Signed)
Appointments scheduled calendar printed per per 3/9 staff message

## 2018-09-23 ENCOUNTER — Inpatient Hospital Stay: Payer: 59

## 2018-09-26 ENCOUNTER — Inpatient Hospital Stay: Payer: 59

## 2018-09-26 ENCOUNTER — Other Ambulatory Visit: Payer: Self-pay

## 2018-09-26 VITALS — BP 142/90 | HR 90 | Temp 97.8°F | Resp 18

## 2018-09-26 DIAGNOSIS — K909 Intestinal malabsorption, unspecified: Secondary | ICD-10-CM

## 2018-09-26 DIAGNOSIS — N921 Excessive and frequent menstruation with irregular cycle: Secondary | ICD-10-CM

## 2018-09-26 DIAGNOSIS — D509 Iron deficiency anemia, unspecified: Secondary | ICD-10-CM | POA: Diagnosis not present

## 2018-09-26 DIAGNOSIS — D500 Iron deficiency anemia secondary to blood loss (chronic): Secondary | ICD-10-CM

## 2018-09-26 MED ORDER — CYANOCOBALAMIN 1000 MCG/ML IJ SOLN
1000.0000 ug | Freq: Once | INTRAMUSCULAR | Status: AC
  Administered 2018-09-26: 1000 ug via INTRAMUSCULAR

## 2018-09-26 MED ORDER — CYANOCOBALAMIN 1000 MCG/ML IJ SOLN
INTRAMUSCULAR | Status: AC
  Filled 2018-09-26: qty 1

## 2018-09-26 MED ORDER — SODIUM CHLORIDE 0.9 % IV SOLN
Freq: Once | INTRAVENOUS | Status: AC
  Administered 2018-09-26: 14:00:00 via INTRAVENOUS
  Filled 2018-09-26: qty 250

## 2018-09-26 MED ORDER — SODIUM CHLORIDE 0.9 % IV SOLN
510.0000 mg | Freq: Once | INTRAVENOUS | Status: AC
  Administered 2018-09-26: 510 mg via INTRAVENOUS
  Filled 2018-09-26: qty 17

## 2018-09-26 NOTE — Patient Instructions (Signed)

## 2018-10-03 ENCOUNTER — Inpatient Hospital Stay: Payer: 59

## 2018-10-03 ENCOUNTER — Other Ambulatory Visit: Payer: Self-pay

## 2018-10-03 VITALS — BP 170/100 | HR 78 | Temp 97.8°F | Resp 18

## 2018-10-03 DIAGNOSIS — K909 Intestinal malabsorption, unspecified: Secondary | ICD-10-CM

## 2018-10-03 DIAGNOSIS — D509 Iron deficiency anemia, unspecified: Secondary | ICD-10-CM | POA: Diagnosis not present

## 2018-10-03 DIAGNOSIS — N921 Excessive and frequent menstruation with irregular cycle: Secondary | ICD-10-CM

## 2018-10-03 DIAGNOSIS — D500 Iron deficiency anemia secondary to blood loss (chronic): Secondary | ICD-10-CM

## 2018-10-03 MED ORDER — CYANOCOBALAMIN 1000 MCG/ML IJ SOLN
1000.0000 ug | Freq: Once | INTRAMUSCULAR | Status: AC
  Administered 2018-10-03: 1000 ug via INTRAMUSCULAR

## 2018-10-03 NOTE — Progress Notes (Signed)
Patient blood pressure 170/100. Patient denies visual changes, chest pain or any other concerns or symptoms. Emeline Gins, NP notified. Patient to follow up with her PCP. Patient verbalized understanding.

## 2018-10-03 NOTE — Patient Instructions (Signed)
Cyanocobalamin, Vitamin B12 injection What is this medicine? CYANOCOBALAMIN (sye an oh koe BAL a min) is a man made form of vitamin B12. Vitamin B12 is used in the growth of healthy blood cells, nerve cells, and proteins in the body. It also helps with the metabolism of fats and carbohydrates. This medicine is used to treat people who can not absorb vitamin B12. This medicine may be used for other purposes; ask your health care provider or pharmacist if you have questions. COMMON BRAND NAME(S): B-12 Compliance Kit, B-12 Injection Kit, Cyomin, LA-12, Nutri-Twelve, Physicians EZ Use B-12, Primabalt What should I tell my health care provider before I take this medicine? They need to know if you have any of these conditions: -kidney disease -Leber's disease -megaloblastic anemia -an unusual or allergic reaction to cyanocobalamin, cobalt, other medicines, foods, dyes, or preservatives -pregnant or trying to get pregnant -breast-feeding How should I use this medicine? This medicine is injected into a muscle or deeply under the skin. It is usually given by a health care professional in a clinic or doctor's office. However, your doctor may teach you how to inject yourself. Follow all instructions. Talk to your pediatrician regarding the use of this medicine in children. Special care may be needed. Overdosage: If you think you have taken too much of this medicine contact a poison control center or emergency room at once. NOTE: This medicine is only for you. Do not share this medicine with others. What if I miss a dose? If you are given your dose at a clinic or doctor's office, call to reschedule your appointment. If you give your own injections and you miss a dose, take it as soon as you can. If it is almost time for your next dose, take only that dose. Do not take double or extra doses. What may interact with this medicine? -colchicine -heavy alcohol intake This list may not describe all possible  interactions. Give your health care provider a list of all the medicines, herbs, non-prescription drugs, or dietary supplements you use. Also tell them if you smoke, drink alcohol, or use illegal drugs. Some items may interact with your medicine. What should I watch for while using this medicine? Visit your doctor or health care professional regularly. You may need blood work done while you are taking this medicine. You may need to follow a special diet. Talk to your doctor. Limit your alcohol intake and avoid smoking to get the best benefit. What side effects may I notice from receiving this medicine? Side effects that you should report to your doctor or health care professional as soon as possible: -allergic reactions like skin rash, itching or hives, swelling of the face, lips, or tongue -blue tint to skin -chest tightness, pain -difficulty breathing, wheezing -dizziness -red, swollen painful area on the leg Side effects that usually do not require medical attention (report to your doctor or health care professional if they continue or are bothersome): -diarrhea -headache This list may not describe all possible side effects. Call your doctor for medical advice about side effects. You may report side effects to FDA at 1-800-FDA-1088. Where should I keep my medicine? Keep out of the reach of children. Store at room temperature between 15 and 30 degrees C (59 and 85 degrees F). Protect from light. Throw away any unused medicine after the expiration date. NOTE: This sheet is a summary. It may not cover all possible information. If you have questions about this medicine, talk to your doctor, pharmacist, or  health care provider.  2019 Elsevier/Gold Standard (2007-10-10 22:10:20)

## 2018-10-04 ENCOUNTER — Telehealth: Payer: Self-pay

## 2018-10-04 NOTE — Telephone Encounter (Signed)
Copied from CRM [id number]. Topic: General - Other >> Oct 04, 2018  8:18 AM Lynne Logan D wrote: Reason for CRM: Pt stated she went to her Hematologist on yesterday. They checked her BP which was 172/100. They advised pt to reach out and let her PCP know. Pt did not check BP this morning as she has no way to do so. Declined to speak with NT and stated that other than a slight headache, she felt fine. Please advise.

## 2018-10-05 NOTE — Telephone Encounter (Signed)
Noted patient is aware 

## 2018-10-05 NOTE — Telephone Encounter (Signed)
That sounds ok to me. We had a few good numbers in the system, so I am not worried that she has this sort of elevation all the time. Let us know if any concerns or if she happens to recheck (like at doc visit; please don't use pharmacy cuff) and it it is still elevated then we may need to discuss again.

## 2018-10-05 NOTE — Telephone Encounter (Signed)
Spoke with patient. She stated that she went to [facility] and BP cuffs are out of stock. She also checked [facility] and they are only delivering essential items at this time. She stated she feels fine and that she is not concerned. Patient ws offered a webex appointment and declines appointment at this time.

## 2018-10-05 NOTE — Telephone Encounter (Signed)
Her blood pressure looked quite good when she was here for last visit. Any chance she can order cuff online or purchase at pharmacy so she can check at home. I don't want to over-react on one elevated number, but would like to make sure that this improves. I would recommend arm cuff and she will need to make sure it fits her properly (should be large adult cuff) so we get accurate reading.  I would like to avoid having her coming out in public places to regularly check bp if possible!

## 2018-10-10 ENCOUNTER — Ambulatory Visit: Payer: Self-pay | Admitting: Obstetrics & Gynecology

## 2018-10-11 ENCOUNTER — Inpatient Hospital Stay: Payer: 59

## 2018-10-11 ENCOUNTER — Other Ambulatory Visit: Payer: Self-pay

## 2018-10-11 VITALS — BP 160/95 | HR 80 | Resp 18

## 2018-10-11 DIAGNOSIS — D500 Iron deficiency anemia secondary to blood loss (chronic): Secondary | ICD-10-CM

## 2018-10-11 DIAGNOSIS — N921 Excessive and frequent menstruation with irregular cycle: Secondary | ICD-10-CM

## 2018-10-11 DIAGNOSIS — D509 Iron deficiency anemia, unspecified: Secondary | ICD-10-CM | POA: Diagnosis not present

## 2018-10-11 DIAGNOSIS — K909 Intestinal malabsorption, unspecified: Secondary | ICD-10-CM

## 2018-10-11 MED ORDER — CYANOCOBALAMIN 1000 MCG/ML IJ SOLN
INTRAMUSCULAR | Status: AC
  Filled 2018-10-11: qty 1

## 2018-10-11 MED ORDER — CYANOCOBALAMIN 1000 MCG/ML IJ SOLN
1000.0000 ug | Freq: Once | INTRAMUSCULAR | Status: AC
  Administered 2018-10-11: 1000 ug via INTRAMUSCULAR

## 2018-10-11 NOTE — Patient Instructions (Signed)
Cyanocobalamin, Vitamin B12 injection What is this medicine? CYANOCOBALAMIN (sye an oh koe BAL a min) is a man made form of vitamin B12. Vitamin B12 is used in the growth of healthy blood cells, nerve cells, and proteins in the body. It also helps with the metabolism of fats and carbohydrates. This medicine is used to treat people who can not absorb vitamin B12. This medicine may be used for other purposes; ask your health care provider or pharmacist if you have questions. COMMON BRAND NAME(S): B-12 Compliance Kit, B-12 Injection Kit, Cyomin, LA-12, Nutri-Twelve, Physicians EZ Use B-12, Primabalt What should I tell my health care provider before I take this medicine? They need to know if you have any of these conditions: -kidney disease -Leber's disease -megaloblastic anemia -an unusual or allergic reaction to cyanocobalamin, cobalt, other medicines, foods, dyes, or preservatives -pregnant or trying to get pregnant -breast-feeding How should I use this medicine? This medicine is injected into a muscle or deeply under the skin. It is usually given by a health care professional in a clinic or doctor's office. However, your doctor may teach you how to inject yourself. Follow all instructions. Talk to your pediatrician regarding the use of this medicine in children. Special care may be needed. Overdosage: If you think you have taken too much of this medicine contact a poison control center or emergency room at once. NOTE: This medicine is only for you. Do not share this medicine with others. What if I miss a dose? If you are given your dose at a clinic or doctor's office, call to reschedule your appointment. If you give your own injections and you miss a dose, take it as soon as you can. If it is almost time for your next dose, take only that dose. Do not take double or extra doses. What may interact with this medicine? -colchicine -heavy alcohol intake This list may not describe all possible  interactions. Give your health care provider a list of all the medicines, herbs, non-prescription drugs, or dietary supplements you use. Also tell them if you smoke, drink alcohol, or use illegal drugs. Some items may interact with your medicine. What should I watch for while using this medicine? Visit your doctor or health care professional regularly. You may need blood work done while you are taking this medicine. You may need to follow a special diet. Talk to your doctor. Limit your alcohol intake and avoid smoking to get the best benefit. What side effects may I notice from receiving this medicine? Side effects that you should report to your doctor or health care professional as soon as possible: -allergic reactions like skin rash, itching or hives, swelling of the face, lips, or tongue -blue tint to skin -chest tightness, pain -difficulty breathing, wheezing -dizziness -red, swollen painful area on the leg Side effects that usually do not require medical attention (report to your doctor or health care professional if they continue or are bothersome): -diarrhea -headache This list may not describe all possible side effects. Call your doctor for medical advice about side effects. You may report side effects to FDA at 1-800-FDA-1088. Where should I keep my medicine? Keep out of the reach of children. Store at room temperature between 15 and 30 degrees C (59 and 85 degrees F). Protect from light. Throw away any unused medicine after the expiration date. NOTE: This sheet is a summary. It may not cover all possible information. If you have questions about this medicine, talk to your doctor, pharmacist, or   health care provider.  2019 Elsevier/Gold Standard (2007-10-10 22:10:20)  

## 2018-10-24 ENCOUNTER — Inpatient Hospital Stay: Payer: 59

## 2018-10-24 ENCOUNTER — Inpatient Hospital Stay: Payer: 59 | Attending: Hematology & Oncology | Admitting: Hematology & Oncology

## 2018-10-24 ENCOUNTER — Ambulatory Visit: Payer: 59 | Admitting: Rheumatology

## 2018-10-24 ENCOUNTER — Other Ambulatory Visit: Payer: Self-pay

## 2018-10-24 ENCOUNTER — Encounter: Payer: Self-pay | Admitting: Hematology & Oncology

## 2018-10-24 VITALS — BP 158/100 | HR 93 | Temp 97.7°F | Resp 16 | Wt 199.0 lb

## 2018-10-24 DIAGNOSIS — D509 Iron deficiency anemia, unspecified: Secondary | ICD-10-CM | POA: Insufficient documentation

## 2018-10-24 DIAGNOSIS — D510 Vitamin B12 deficiency anemia due to intrinsic factor deficiency: Secondary | ICD-10-CM

## 2018-10-24 DIAGNOSIS — D500 Iron deficiency anemia secondary to blood loss (chronic): Secondary | ICD-10-CM

## 2018-10-24 DIAGNOSIS — Z79899 Other long term (current) drug therapy: Secondary | ICD-10-CM | POA: Insufficient documentation

## 2018-10-24 LAB — CBC WITH DIFFERENTIAL (CANCER CENTER ONLY)
Abs Immature Granulocytes: 0 10*3/uL (ref 0.00–0.07)
Basophils Absolute: 0 10*3/uL (ref 0.0–0.1)
Basophils Relative: 1 %
Eosinophils Absolute: 0.1 10*3/uL (ref 0.0–0.5)
Eosinophils Relative: 3 %
HCT: 39.1 % (ref 36.0–46.0)
Hemoglobin: 11.9 g/dL — ABNORMAL LOW (ref 12.0–15.0)
Immature Granulocytes: 0 %
Lymphocytes Relative: 47 %
Lymphs Abs: 1.4 10*3/uL (ref 0.7–4.0)
MCH: 24.1 pg — ABNORMAL LOW (ref 26.0–34.0)
MCHC: 30.4 g/dL (ref 30.0–36.0)
MCV: 79.1 fL — ABNORMAL LOW (ref 80.0–100.0)
Monocytes Absolute: 0.3 10*3/uL (ref 0.1–1.0)
Monocytes Relative: 9 %
Neutro Abs: 1.3 10*3/uL — ABNORMAL LOW (ref 1.7–7.7)
Neutrophils Relative %: 40 %
Platelet Count: 312 10*3/uL (ref 150–400)
RBC: 4.94 MIL/uL (ref 3.87–5.11)
WBC Count: 3.1 10*3/uL — ABNORMAL LOW (ref 4.0–10.5)
nRBC: 0 % (ref 0.0–0.2)

## 2018-10-24 LAB — RETICULOCYTES
Immature Retic Fract: 5.9 % (ref 2.3–15.9)
RBC.: 4.89 MIL/uL (ref 3.87–5.11)
Retic Count, Absolute: 30.3 10*3/uL (ref 19.0–186.0)
Retic Ct Pct: 0.6 % (ref 0.4–3.1)

## 2018-10-24 MED ORDER — TELMISARTAN 40 MG PO TABS: 40.0000 mg | ORAL_TABLET | Freq: Every day | ORAL | 4 refills | Status: DC

## 2018-10-24 NOTE — Progress Notes (Signed)
[name]  Paula Sherman [facility] [id number] [birth year] 43 y.o. 10/24/2018   Principle Diagnosis:   Iron deficiency anemia-chronic iron loss  Pernicious anemia  Current Therapy:    IV iron-Feraheme given on 09/26/2018  Vitamin B-12 1000 mcg IM monthly     Interim History:  Paula Sherman is back for follow-up.  This is her second office visit.  We first saw her back in early March.  At that time, she was quite anemic.  Her iron studies showed a ferritin less than 4 with an iron saturation of only 4%.  She had a low B12 level of 108.  She got IV iron.  She started IM vitamin B12.  She has responded incredibly well.  She no longer is chewing ice.  She is very active.  Her family is trying to slow her down now.  She really has responded incredibly well.  Her MCV has come up nicely.  She has had no problems with her monthly cycle.  She has had no change in bowel or bladder habits.  She has had no leg swelling.  She has had no rashes.  Overall, her performance status is ECOG 0.  Medications:  Current Outpatient Medications:  .  folic acid (FOLVITE) 1 MG tablet, Take 2 tablets (2 mg total) by mouth daily., Disp: 180 tablet, Rfl: 3 .  Melatonin 10 MG TABS, Take 10 mg by mouth daily as needed., Disp: , Rfl:   Allergies:  Allergies  Allergen Reactions  . Penicillins Anaphylaxis  . Latex Rash    Past Medical History, Surgical history, Social history, and Family History were reviewed and updated.  Review of Systems: Review of Systems  Constitutional: Negative.   HENT:  Negative.   Eyes: Negative.   Respiratory: Negative.   Cardiovascular: Negative.   Gastrointestinal: Negative.   Endocrine: Negative.   Genitourinary: Negative.    Musculoskeletal: Negative.   Skin: Negative.   Neurological: Negative.   Hematological: Negative.   Psychiatric/Behavioral: Negative.     Physical Exam:  weight is 199 lb (90.3 kg). Her oral temperature is  97.7 F (36.5 C). Her blood pressure is 158/100 (abnormal) and her pulse is 93. Her respiration is 16 and oxygen saturation is 100%.   Wt Readings from Last 3 Encounters:  10/24/18 199 lb (90.3 kg)  09/12/18 196 lb 8 oz (89.1 kg)  09/07/18 194 lb (88 kg)    Physical Exam Vitals signs reviewed.  HENT:     Head: Normocephalic and atraumatic.  Eyes:     Pupils: Pupils are equal, round, and reactive to light.  Neck:     Musculoskeletal: Normal range of motion.  Cardiovascular:     Rate and Rhythm: Normal rate and regular rhythm.     Heart sounds: Normal heart sounds.  Pulmonary:     Effort: Pulmonary effort is normal.     Breath sounds: Normal breath sounds.  Abdominal:     General: Bowel sounds are normal.     Palpations: Abdomen is soft.  Musculoskeletal: Normal range of motion.        General: No tenderness or deformity.  Lymphadenopathy:     Cervical: No cervical adenopathy.  Skin:    General: Skin is warm and dry.     Findings: No erythema or rash.  Neurological:     Mental Status: She is alert and oriented to person, place, and time.  Psychiatric:        Behavior: Behavior normal.  Thought Content: Thought content normal.        Judgment: Judgment normal.      Lab Results  Component Value Date   WBC 3.1 (L) 10/24/2018   HGB 11.9 (L) 10/24/2018   HCT 39.1 10/24/2018   MCV 79.1 (L) 10/24/2018   PLT 312 10/24/2018     Chemistry      Component Value Date/Time   NA 135 09/12/2018 1356   K 3.1 (L) 09/12/2018 1356   CL 100 09/12/2018 1356   CO2 26 09/12/2018 1356   BUN 9 09/12/2018 1356   CREATININE 0.81 09/12/2018 1356      Component Value Date/Time   CALCIUM 9.5 09/12/2018 1356   ALKPHOS 65 09/12/2018 1356   AST 14 (L) 09/12/2018 1356   ALT 7 09/12/2018 1356   BILITOT 0.8 09/12/2018 1356         Impression and Plan: Paula Sherman is a 43 year old African-American female.  She has both iron deficiency anemia and pernicious anemia.   Clearly, both being treated has helped her gain a quick recovery of her blood counts.  Clinically, she is doing so much better.  We will see what her iron levels are.  Her MCV is still a little on the low side.  If she needs another dose of iron, we will certainly give it to her.  She is not due for another vitamin B12 injection until we see her back.  We will have to see what the parietal cell antibodies and the intrinsic factor antibodies show.  I am just happy that her quality of life is doing better.  This is a true blessing.  We will have her come back to see us in another month or so.   Josph MachoPeter R Mayeli Bornhorst, MD 4/13/20202:23 PM

## 2018-10-25 ENCOUNTER — Telehealth: Payer: Self-pay | Admitting: *Deleted

## 2018-10-25 LAB — IRON AND TIBC
Iron: 37 ug/dL — ABNORMAL LOW (ref 41–142)
Saturation Ratios: 16 % — ABNORMAL LOW (ref 21–57)
TIBC: 234 ug/dL — ABNORMAL LOW (ref 236–444)
UIBC: 196 ug/dL (ref 120–384)

## 2018-10-25 LAB — INTRINSIC FACTOR ANTIBODIES: Intrinsic Factor: 1.1 AU/mL (ref 0.0–1.1)

## 2018-10-25 LAB — FERRITIN: Ferritin: 93 ng/mL (ref 11–307)

## 2018-10-25 NOTE — Progress Notes (Signed)
Can you check with her and see if she has ability to check bp at home? I saw recent hematology visit and pressure was pretty elevated. If she can check at home and report back to me in 1-2 weeks her daily readings that would be helpful!

## 2018-10-25 NOTE — Telephone Encounter (Signed)
-----   Message from Josph Macho, MD sent at 10/25/2018 11:58 AM EDT ----- Call - the iron is better but still low.  She needs 1 more does of Feraheme!!  Please set up!!  Cindee Lame

## 2018-10-25 NOTE — Telephone Encounter (Signed)
Notified pt per order of Dr. Myna Hidalgo that the iron is better but still low and that she will need one more dose of Feraheme. Message sent to scheduling.

## 2018-10-26 ENCOUNTER — Ambulatory Visit: Payer: 59 | Admitting: Rheumatology

## 2018-11-02 ENCOUNTER — Inpatient Hospital Stay: Payer: 59

## 2018-11-02 ENCOUNTER — Other Ambulatory Visit: Payer: Self-pay | Admitting: Family

## 2018-11-02 ENCOUNTER — Other Ambulatory Visit: Payer: Self-pay

## 2018-11-02 VITALS — BP 156/106 | Temp 97.8°F

## 2018-11-02 DIAGNOSIS — K909 Intestinal malabsorption, unspecified: Secondary | ICD-10-CM

## 2018-11-02 DIAGNOSIS — N921 Excessive and frequent menstruation with irregular cycle: Secondary | ICD-10-CM

## 2018-11-02 DIAGNOSIS — D500 Iron deficiency anemia secondary to blood loss (chronic): Secondary | ICD-10-CM

## 2018-11-02 DIAGNOSIS — D509 Iron deficiency anemia, unspecified: Secondary | ICD-10-CM | POA: Diagnosis not present

## 2018-11-02 MED ORDER — SODIUM CHLORIDE 0.9 % IV SOLN
510.0000 mg | Freq: Once | INTRAVENOUS | Status: AC
  Administered 2018-11-02: 510 mg via INTRAVENOUS
  Filled 2018-11-02: qty 17

## 2018-11-02 MED ORDER — SODIUM CHLORIDE 0.9 % IV SOLN
Freq: Once | INTRAVENOUS | Status: AC
  Administered 2018-11-02: 13:00:00 via INTRAVENOUS
  Filled 2018-11-02: qty 250

## 2018-11-02 NOTE — Patient Instructions (Signed)
Anemia  Anemia is a condition in which you do not have enough red blood cells or hemoglobin. Hemoglobin is a substance in red blood cells that carries oxygen. When you do not have enough red blood cells or hemoglobin (are anemic), your body cannot get enough oxygen and your organs may not work properly. As a result, you may feel very tired or have other problems. What are the causes? Common causes of anemia include:  Excessive bleeding. Anemia can be caused by excessive bleeding inside or outside the body, including bleeding from the intestine or from periods in women.  Poor nutrition.  Long-lasting (chronic) kidney, thyroid, and liver disease.  Bone marrow disorders.  Cancer and treatments for cancer.  HIV (human immunodeficiency virus) and AIDS (acquired immunodeficiency syndrome).  Treatments for HIV and AIDS.  Spleen problems.  Blood disorders.  Infections, medicines, and autoimmune disorders that destroy red blood cells. What are the signs or symptoms? Symptoms of this condition include:  Minor weakness.  Dizziness.  Headache.  Feeling heartbeats that are irregular or faster than normal (palpitations).  Shortness of breath, especially with exercise.  Paleness.  Cold sensitivity.  Indigestion.  Nausea.  Difficulty sleeping.  Difficulty concentrating. Symptoms may occur suddenly or develop slowly. If your anemia is mild, you may not have symptoms. How is this diagnosed? This condition is diagnosed based on:  Blood tests.  Your medical history.  A physical exam.  Bone marrow biopsy. Your health care provider may also check your stool (feces) for blood and may do additional testing to look for the cause of your bleeding. You may also have other tests, including:  Imaging tests, such as a CT scan or MRI.  Endoscopy.  Colonoscopy. How is this treated? Treatment for this condition depends on the cause. If you continue to lose a lot of blood, you may  need to be treated at a hospital. Treatment may include:  Taking supplements of iron, vitamin U13, or folic acid.  Taking a hormone medicine (erythropoietin) that can help to stimulate red blood cell growth.  Having a blood transfusion. This may be needed if you lose a lot of blood.  Making changes to your diet.  Having surgery to remove your spleen. Follow these instructions at home:  Take over-the-counter and prescription medicines only as told by your health care provider.  Take supplements only as told by your health care provider.  Follow any diet instructions that you were given.  Keep all follow-up visits as told by your health care provider. This is important. Contact a health care provider if:  You develop new bleeding anywhere in the body. Get help right away if:  You are very weak.  You are short of breath.  You have pain in your abdomen or chest.  You are dizzy or feel faint.  You have trouble concentrating.  You have bloody or black, tarry stools.  You vomit repeatedly or you vomit up blood. Summary  Anemia is a condition in which you do not have enough red blood cells or enough of a substance in your red blood cells that carries oxygen (hemoglobin).  Symptoms may occur suddenly or develop slowly.  If your anemia is mild, you may not have symptoms.  This condition is diagnosed with blood tests as well as a medical history and physical exam. Other tests may be needed.  Treatment for this condition depends on the cause of the anemia. This information is not intended to replace advice given to you by  your health care provider. Make sure you discuss any questions you have with your health care provider. Document Released: 08/06/2004 Document Revised: 07/31/2016 Document Reviewed: 07/31/2016 Elsevier Interactive Patient Education  2019 [employer].

## 2018-11-03 ENCOUNTER — Ambulatory Visit: Payer: Self-pay | Admitting: Obstetrics & Gynecology

## 2018-11-18 ENCOUNTER — Ambulatory Visit: Payer: 59 | Admitting: Rheumatology

## 2018-11-23 ENCOUNTER — Encounter: Payer: Self-pay | Admitting: Hematology & Oncology

## 2018-11-23 ENCOUNTER — Inpatient Hospital Stay: Payer: 59

## 2018-11-23 ENCOUNTER — Inpatient Hospital Stay: Payer: 59 | Attending: Hematology & Oncology | Admitting: Hematology & Oncology

## 2018-11-23 ENCOUNTER — Other Ambulatory Visit: Payer: Self-pay | Admitting: Family

## 2018-11-23 ENCOUNTER — Other Ambulatory Visit: Payer: Self-pay

## 2018-11-23 VITALS — BP 158/91 | HR 93 | Temp 98.0°F | Resp 20 | Wt 199.8 lb

## 2018-11-23 DIAGNOSIS — K909 Intestinal malabsorption, unspecified: Secondary | ICD-10-CM

## 2018-11-23 DIAGNOSIS — N921 Excessive and frequent menstruation with irregular cycle: Secondary | ICD-10-CM

## 2018-11-23 DIAGNOSIS — D500 Iron deficiency anemia secondary to blood loss (chronic): Secondary | ICD-10-CM

## 2018-11-23 DIAGNOSIS — D510 Vitamin B12 deficiency anemia due to intrinsic factor deficiency: Secondary | ICD-10-CM | POA: Diagnosis not present

## 2018-11-23 LAB — RETICULOCYTES
Immature Retic Fract: 2.9 % (ref 2.3–15.9)
RBC.: 4.42 MIL/uL (ref 3.87–5.11)
Retic Count, Absolute: 38.5 10*3/uL (ref 19.0–186.0)
Retic Ct Pct: 0.9 % (ref 0.4–3.1)

## 2018-11-23 LAB — CMP (CANCER CENTER ONLY)
ALT: 9 U/L (ref 0–44)
AST: 14 U/L — ABNORMAL LOW (ref 15–41)
Albumin: 3.9 g/dL (ref 3.5–5.0)
Alkaline Phosphatase: 61 U/L (ref 38–126)
Anion gap: 10 (ref 5–15)
BUN: 8 mg/dL (ref 6–20)
CO2: 25 mmol/L (ref 22–32)
Calcium: 9.3 mg/dL (ref 8.9–10.3)
Chloride: 102 mmol/L (ref 98–111)
Creatinine: 0.82 mg/dL (ref 0.44–1.00)
GFR, Est AFR Am: >60 mL/min (ref >60)
GFR, Estimated: >60 mL/min (ref >60)
Glucose, Bld: 153 mg/dL — ABNORMAL HIGH (ref 70–99)
Potassium: 3.2 mmol/L — ABNORMAL LOW (ref 3.5–5.1)
Sodium: 137 mmol/L (ref 135–145)
Total Bilirubin: 0.6 mg/dL (ref 0.3–1.2)
Total Protein: 7.4 g/dL (ref 6.5–8.1)

## 2018-11-23 LAB — VITAMIN B12: Vitamin B-12: 230 pg/mL (ref 180–914)

## 2018-11-23 LAB — CBC WITH DIFFERENTIAL (CANCER CENTER ONLY)
Abs Immature Granulocytes: 0 10*3/uL (ref 0.00–0.07)
Basophils Absolute: 0 10*3/uL (ref 0.0–0.1)
Basophils Relative: 1 %
Eosinophils Absolute: 0.1 10*3/uL (ref 0.0–0.5)
Eosinophils Relative: 3 %
HCT: 36.3 % (ref 36.0–46.0)
Hemoglobin: 11.8 g/dL — ABNORMAL LOW (ref 12.0–15.0)
Immature Granulocytes: 0 %
Lymphocytes Relative: 57 %
Lymphs Abs: 1.3 10*3/uL (ref 0.7–4.0)
MCH: 26.7 pg (ref 26.0–34.0)
MCHC: 32.5 g/dL (ref 30.0–36.0)
MCV: 82.1 fL (ref 80.0–100.0)
Monocytes Absolute: 0.2 10*3/uL (ref 0.1–1.0)
Monocytes Relative: 9 %
Neutro Abs: 0.7 10*3/uL — ABNORMAL LOW (ref 1.7–7.7)
Neutrophils Relative %: 30 %
Platelet Count: 270 10*3/uL (ref 150–400)
RBC: 4.42 MIL/uL (ref 3.87–5.11)
RDW: 20 % — ABNORMAL HIGH (ref 11.5–15.5)
WBC Count: 2.4 10*3/uL — ABNORMAL LOW (ref 4.0–10.5)
nRBC: 0 % (ref 0.0–0.2)

## 2018-11-23 MED ORDER — CYANOCOBALAMIN 1000 MCG/ML IJ SOLN
INTRAMUSCULAR | Status: AC
  Filled 2018-11-23: qty 1

## 2018-11-23 MED ORDER — CYANOCOBALAMIN 1000 MCG/ML IJ SOLN
1000.0000 ug | Freq: Once | INTRAMUSCULAR | Status: AC
  Administered 2018-11-23: 1000 ug via INTRAMUSCULAR

## 2018-11-23 NOTE — Patient Instructions (Signed)
Cyanocobalamin, Vitamin B12 injection What is this medicine? CYANOCOBALAMIN (sye an oh koe BAL a min) is a man made form of vitamin B12. Vitamin B12 is used in the growth of healthy blood cells, nerve cells, and proteins in the body. It also helps with the metabolism of fats and carbohydrates. This medicine is used to treat people who can not absorb vitamin B12. This medicine may be used for other purposes; ask your health care provider or pharmacist if you have questions. COMMON BRAND NAME(S): B-12 Compliance Kit, B-12 Injection Kit, Cyomin, LA-12, Nutri-Twelve, Physicians EZ Use B-12, Primabalt What should I tell my health care provider before I take this medicine? They need to know if you have any of these conditions: -kidney disease -Leber's disease -megaloblastic anemia -an unusual or allergic reaction to cyanocobalamin, cobalt, other medicines, foods, dyes, or preservatives -pregnant or trying to get pregnant -breast-feeding How should I use this medicine? This medicine is injected into a muscle or deeply under the skin. It is usually given by a health care professional in a clinic or doctor's office. However, your doctor may teach you how to inject yourself. Follow all instructions. Talk to your pediatrician regarding the use of this medicine in children. Special care may be needed. Overdosage: If you think you have taken too much of this medicine contact a poison control center or emergency room at once. NOTE: This medicine is only for you. Do not share this medicine with others. What if I miss a dose? If you are given your dose at a clinic or doctor's office, call to reschedule your appointment. If you give your own injections and you miss a dose, take it as soon as you can. If it is almost time for your next dose, take only that dose. Do not take double or extra doses. What may interact with this medicine? -colchicine -heavy alcohol intake This list may not describe all possible  interactions. Give your health care provider a list of all the medicines, herbs, non-prescription drugs, or dietary supplements you use. Also tell them if you smoke, drink alcohol, or use illegal drugs. Some items may interact with your medicine. What should I watch for while using this medicine? Visit your doctor or health care professional regularly. You may need blood work done while you are taking this medicine. You may need to follow a special diet. Talk to your doctor. Limit your alcohol intake and avoid smoking to get the best benefit. What side effects may I notice from receiving this medicine? Side effects that you should report to your doctor or health care professional as soon as possible: -allergic reactions like skin rash, itching or hives, swelling of the face, lips, or tongue -blue tint to skin -chest tightness, pain -difficulty breathing, wheezing -dizziness -red, swollen painful area on the leg Side effects that usually do not require medical attention (report to your doctor or health care professional if they continue or are bothersome): -diarrhea -headache This list may not describe all possible side effects. Call your doctor for medical advice about side effects. You may report side effects to FDA at 1-800-FDA-1088. Where should I keep my medicine? Keep out of the reach of children. Store at room temperature between 15 and 30 degrees C (59 and 85 degrees F). Protect from light. Throw away any unused medicine after the expiration date. NOTE: This sheet is a summary. It may not cover all possible information. If you have questions about this medicine, talk to your doctor, pharmacist, or   health care provider.  2019 Elsevier/Gold Standard (2007-10-10 22:10:20)  

## 2018-11-23 NOTE — Progress Notes (Signed)
[name]  Paula Sherman [facility] [id number] 11/07/1975 43 y.o. 11/23/2018   Principle Diagnosis:   Iron deficiency anemia-chronic iron loss  Pernicious anemia  Current Therapy:    IV iron-Feraheme given on 09/26/2018  Vitamin B-12 1000 mcg IM monthly     Interim History:  Paula Sherman is back for follow-up.  She is doing quite well.  She is no longer chewing ice.  She has good energy.  She has had no problems with nausea or vomiting.  She currently has her monthly cycle.  Thankfully, her hemoglobin is holding steady.  There is no numbness or tingling on her hands or feet.  She had a very nice Mother's Day weekend.  Overall, her performance status is ECOG 0.  Medications:  Current Outpatient Medications:  .  folic acid (FOLVITE) 1 MG tablet, Take 2 tablets (2 mg total) by mouth daily., Disp: 180 tablet, Rfl: 3 .  Melatonin 10 MG TABS, Take 10 mg by mouth daily as needed., Disp: , Rfl:  .  telmisartan (MICARDIS) 40 MG tablet, Take 1 tablet (40 mg total) by mouth daily., Disp: 30 tablet, Rfl: 4  Allergies:  Allergies  Allergen Reactions  . Penicillins Anaphylaxis  . Latex Rash    Past Medical History, Surgical history, Social history, and Family History were reviewed and updated.  Review of Systems: Review of Systems  Constitutional: Negative.   HENT:  Negative.   Eyes: Negative.   Respiratory: Negative.   Cardiovascular: Negative.   Gastrointestinal: Negative.   Endocrine: Negative.   Genitourinary: Negative.    Musculoskeletal: Negative.   Skin: Negative.   Neurological: Negative.   Hematological: Negative.   Psychiatric/Behavioral: Negative.     Physical Exam:  weight is 199 lb 12.8 oz (90.6 kg). Her oral temperature is 98 F (36.7 C). Her blood pressure is 158/91 (abnormal) and her pulse is 93. Her respiration is 20 and oxygen saturation is 100%.   Wt Readings from Last 3 Encounters:  11/23/18 199 lb 12.8 oz  (90.6 kg)  10/24/18 199 lb (90.3 kg)  09/12/18 196 lb 8 oz (89.1 kg)    Physical Exam Vitals signs reviewed.  HENT:     Head: Normocephalic and atraumatic.  Eyes:     Pupils: Pupils are equal, round, and reactive to light.  Neck:     Musculoskeletal: Normal range of motion.  Cardiovascular:     Rate and Rhythm: Normal rate and regular rhythm.     Heart sounds: Normal heart sounds.  Pulmonary:     Effort: Pulmonary effort is normal.     Breath sounds: Normal breath sounds.  Abdominal:     General: Bowel sounds are normal.     Palpations: Abdomen is soft.  Musculoskeletal: Normal range of motion.        General: No tenderness or deformity.  Lymphadenopathy:     Cervical: No cervical adenopathy.  Skin:    General: Skin is warm and dry.     Findings: No erythema or rash.  Neurological:     Mental Status: She is alert and oriented to person, place, and time.  Psychiatric:        Behavior: Behavior normal.        Thought Content: Thought content normal.        Judgment: Judgment normal.      Lab Results  Component Value Date   WBC 2.4 (L) 11/23/2018   HGB 11.8 (L) 11/23/2018   HCT 36.3 11/23/2018  MCV 82.1 11/23/2018   PLT 270 11/23/2018     Chemistry      Component Value Date/Time   NA 137 11/23/2018 1410   K 3.2 (L) 11/23/2018 1410   CL 102 11/23/2018 1410   CO2 25 11/23/2018 1410   BUN 8 11/23/2018 1410   CREATININE 0.82 11/23/2018 1410      Component Value Date/Time   CALCIUM 9.3 11/23/2018 1410   ALKPHOS 61 11/23/2018 1410   AST 14 (L) 11/23/2018 1410   ALT 9 11/23/2018 1410   BILITOT 0.6 11/23/2018 1410         Impression and Plan: Paula Sherman is a 43 year old African-American female.  She has both iron deficiency anemia and pernicious anemia.  Clearly, both being treated has helped her gain a quick recovery of her blood counts.  Clinically, she is doing so much better.  I am glad to see that her MCV is gradually coming up.  This has to be  that her iron levels should be okay.  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  This really should not be an issue for her or should not cause an increased risk of infection.  She will get her vitamin B-12 today.  We will have her come back in another month.     Josph MachoPeter R Christinamarie Tall, MD 5/13/20202:55 PM

## 2018-11-24 ENCOUNTER — Telehealth: Payer: Self-pay | Admitting: *Deleted

## 2018-11-24 ENCOUNTER — Telehealth: Payer: Self-pay | Admitting: Hematology & Oncology

## 2018-11-24 LAB — IRON AND TIBC
Iron: 43 ug/dL (ref 41–142)
Saturation Ratios: 23 % (ref 21–57)
TIBC: 190 ug/dL — ABNORMAL LOW (ref 236–444)
UIBC: 147 ug/dL (ref 120–384)

## 2018-11-24 LAB — FERRITIN: Ferritin: 227 ng/mL (ref 11–307)

## 2018-11-24 NOTE — Telephone Encounter (Signed)
lvm to inform pt of 6/10 appts at 2 pm per 5/13 LOS

## 2018-11-24 NOTE — Telephone Encounter (Signed)
-----   Message from Josph Macho, MD sent at 11/24/2018 11:02 AM EDT ----- Call - the iron level is now normal!!  Cindee Lame

## 2018-11-24 NOTE — Telephone Encounter (Signed)
Pt notified per order of Dr. Myna Hidalgo that the iron level is now normal.  Patient appreciative of call and has no questions or concerns at this time.

## 2018-11-25 ENCOUNTER — Ambulatory Visit: Payer: Self-pay | Admitting: Obstetrics & Gynecology

## 2018-12-14 ENCOUNTER — Telehealth: Payer: Self-pay | Admitting: *Deleted
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
## 2018-12-14 NOTE — Telephone Encounter (Signed)
Call received from physician with [facility] to inform Dr. Myna Hidalgo that Duncan Dull will no longer be covered under patient's insurance.  Ferrlecit, Infed and Venofer are now the choice drugs for patient's insurance plan.  Dr. Myna Hidalgo and Pharmacy applied.

## 2018-12-16 ENCOUNTER — Other Ambulatory Visit: Payer: Self-pay | Admitting: Family

## 2018-12-21 ENCOUNTER — Other Ambulatory Visit: Payer: Self-pay

## 2018-12-21 ENCOUNTER — Inpatient Hospital Stay: Payer: 59

## 2018-12-21 ENCOUNTER — Inpatient Hospital Stay: Payer: 59 | Attending: Hematology & Oncology | Admitting: Family

## 2018-12-21 ENCOUNTER — Encounter: Payer: Self-pay | Admitting: Family

## 2018-12-21 VITALS — BP 144/97 | HR 87 | Resp 17

## 2018-12-21 DIAGNOSIS — D500 Iron deficiency anemia secondary to blood loss (chronic): Secondary | ICD-10-CM | POA: Diagnosis not present

## 2018-12-21 DIAGNOSIS — D510 Vitamin B12 deficiency anemia due to intrinsic factor deficiency: Secondary | ICD-10-CM

## 2018-12-21 DIAGNOSIS — N921 Excessive and frequent menstruation with irregular cycle: Secondary | ICD-10-CM

## 2018-12-21 DIAGNOSIS — Z79899 Other long term (current) drug therapy: Secondary | ICD-10-CM | POA: Diagnosis not present

## 2018-12-21 DIAGNOSIS — K909 Intestinal malabsorption, unspecified: Secondary | ICD-10-CM

## 2018-12-21 LAB — CBC WITH DIFFERENTIAL (CANCER CENTER ONLY)
Abs Immature Granulocytes: 0 10*3/uL (ref 0.00–0.07)
Basophils Absolute: 0 10*3/uL (ref 0.0–0.1)
Basophils Relative: 1 %
Eosinophils Absolute: 0.1 10*3/uL (ref 0.0–0.5)
Eosinophils Relative: 3 %
HCT: 38 % (ref 36.0–46.0)
Hemoglobin: 12.5 g/dL (ref 12.0–15.0)
Immature Granulocytes: 0 %
Lymphocytes Relative: 54 %
Lymphs Abs: 1.2 10*3/uL (ref 0.7–4.0)
MCH: 28.2 pg (ref 26.0–34.0)
MCHC: 32.9 g/dL (ref 30.0–36.0)
MCV: 85.6 fL (ref 80.0–100.0)
Monocytes Absolute: 0.2 10*3/uL (ref 0.1–1.0)
Monocytes Relative: 9 %
Neutro Abs: 0.8 10*3/uL — ABNORMAL LOW (ref 1.7–7.7)
Neutrophils Relative %: 33 %
Platelet Count: 265 10*3/uL (ref 150–400)
RBC: 4.44 MIL/uL (ref 3.87–5.11)
RDW: 14.1 % (ref 11.5–15.5)
WBC Count: 2.3 10*3/uL — ABNORMAL LOW (ref 4.0–10.5)
nRBC: 0 % (ref 0.0–0.2)

## 2018-12-21 LAB — CMP (CANCER CENTER ONLY)
ALT: 9 U/L (ref 0–44)
AST: 14 U/L — ABNORMAL LOW (ref 15–41)
Albumin: 4.2 g/dL (ref 3.5–5.0)
Alkaline Phosphatase: 61 U/L (ref 38–126)
Anion gap: 9 (ref 5–15)
BUN: 8 mg/dL (ref 6–20)
CO2: 25 mmol/L (ref 22–32)
Calcium: 9.4 mg/dL (ref 8.9–10.3)
Chloride: 102 mmol/L (ref 98–111)
Creatinine: 0.83 mg/dL (ref 0.44–1.00)
GFR, Est AFR Am: >60 mL/min (ref >60)
GFR, Estimated: >60 mL/min (ref >60)
Glucose, Bld: 111 mg/dL — ABNORMAL HIGH (ref 70–99)
Potassium: 3.1 mmol/L — ABNORMAL LOW (ref 3.5–5.1)
Sodium: 136 mmol/L (ref 135–145)
Total Bilirubin: 0.9 mg/dL (ref 0.3–1.2)
Total Protein: 8.1 g/dL (ref 6.5–8.1)

## 2018-12-21 LAB — RETIC PANEL
Immature Retic Fract: 5.1 % (ref 2.3–15.9)
RBC.: 4.44 MIL/uL (ref 3.87–5.11)
Retic Count, Absolute: 28.4 10*3/uL (ref 19.0–186.0)
Retic Ct Pct: 0.6 % (ref 0.4–3.1)
Reticulocyte Hemoglobin: 32.9 pg (ref >27.9)

## 2018-12-21 LAB — SAVE SMEAR(SSMR), FOR PROVIDER SLIDE REVIEW

## 2018-12-21 MED ORDER — CYANOCOBALAMIN 1000 MCG/ML IJ SOLN
1000.0000 ug | Freq: Once | INTRAMUSCULAR | Status: AC
  Administered 2018-12-21: 1000 ug via INTRAMUSCULAR

## 2018-12-21 MED ORDER — CYANOCOBALAMIN 1000 MCG/ML IJ SOLN
INTRAMUSCULAR | Status: AC
  Filled 2018-12-21: qty 1

## 2018-12-21 NOTE — Progress Notes (Signed)
[name]  Paula Sherman [facility] [id number] 1976/01/03 43 y.o. 12/21/2018   Principle Diagnosis:  Iron deficiency anemia-chronic iron loss Pernicious anemia  Current Therapy:   IV iron as indicated  Vitamin B-12 1000 mcg IM monthly   Interim History:  Paula Sherman is here today for follow-up. She is doing well and has no complaints at this time.  She finished her cycle yesterday and states that it was heavy but she not noted large clots. She will follow-up with gynecology for further work-up.  She has had no other bleeding. No bruising or petechiae.  No fever, chills, chewing ice, n/v, cough, rash, dizziness, SOB, chest pain, palpitations, abdominal pain or changes in bowel or bladder habits.  No swelling, tenderness, numbness or tingling in her extremities.  No lymphadenopathy noted on exam.  She has maintained a good appetite and is staying well hydrated. Her weight is stable.  She is staying active and walking for exercise.   ECOG Performance Status: 0 - Asymptomatic  Medications:  Allergies as of 12/21/2018      Reactions   Penicillins Anaphylaxis   Latex Rash      Medication List       Accurate as of December 21, 2018  2:27 PM. If you have any questions, ask your nurse or doctor.        folic acid 1 MG tablet Commonly known as:  FOLVITE Take 2 tablets (2 mg total) by mouth daily.   Melatonin 10 MG Tabs Take 10 mg by mouth daily as needed.   telmisartan 40 MG tablet Commonly known as:  Micardis Take 1 tablet (40 mg total) by mouth daily.       Allergies:  Allergies  Allergen Reactions  . Penicillins Anaphylaxis  . Latex Rash    Past Medical History, Surgical history, Social history, and Family History were reviewed and updated.  Review of Systems: All other 10 point review of systems is negative.   Physical Exam:  blood pressure is 144/97 (abnormal) and her pulse is 87. Her respiration is 17 and oxygen saturation  is 100%.   Wt Readings from Last 3 Encounters:  11/23/18 199 lb 12.8 oz (90.6 kg)  10/24/18 199 lb (90.3 kg)  09/12/18 196 lb 8 oz (89.1 kg)    Ocular: Sclerae unicteric, pupils equal, round and reactive to light Ear-nose-throat: Oropharynx clear, dentition fair Lymphatic: No cervical or supraclavicular adenopathy Lungs no rales or rhonchi, good excursion bilaterally Heart regular rate and rhythm, no murmur appreciated Abd soft, nontender, positive bowel sounds, no liver or spleen tip palpated on exam, no fluid wave  MSK no focal spinal tenderness, no joint edema Neuro: non-focal, well-oriented, appropriate affect Breasts: Deferred   Lab Results  Component Value Date   WBC 2.3 (L) 12/21/2018   HGB 12.5 12/21/2018   HCT 38.0 12/21/2018   MCV 85.6 12/21/2018   PLT 265 12/21/2018   Lab Results  Component Value Date   FERRITIN 227 11/23/2018   IRON 43 11/23/2018   TIBC 190 (L) 11/23/2018   UIBC 147 11/23/2018   IRONPCTSAT 23 11/23/2018   Lab Results  Component Value Date   RETICCTPCT 0.6 12/21/2018   RBC 4.44 12/21/2018   RBC 4.44 12/21/2018   No results found for: KPAFRELGTCHN, LAMBDASER, KAPLAMBRATIO No results found for: IGGSERUM, IGA, IGMSERUM No results found for: TOTALPROTELP, ALBUMINELP, A1GS, A2GS, BETS, BETA2SER, GAMS, MSPIKE, SPEI   Chemistry      Component Value Date/Time   NA  137 11/23/2018 1410   K 3.2 (L) 11/23/2018 1410   CL 102 11/23/2018 1410   CO2 25 11/23/2018 1410   BUN 8 11/23/2018 1410   CREATININE 0.82 11/23/2018 1410      Component Value Date/Time   CALCIUM 9.3 11/23/2018 1410   ALKPHOS 61 11/23/2018 1410   AST 14 (L) 11/23/2018 1410   ALT 9 11/23/2018 1410   BILITOT 0.6 11/23/2018 1410       Impression and Plan: Paula Sherman is a very pleasant 43 yo African American female with both iron deficiency anemia as well as pernicious anemia.  She has responded nicely to B 12 and IV iron.  Hgb is up to 12.5 and MCV 85.  We will see  what her iron studies show and bring her back in for infusion if needed.  We will go ahead and plan to see her back in another 3 months.  She will contact our office with any questions or concerns. We can certainly see her sooner if need be.  Emeline GinsSarah Aimee Heldman, NP 6/10/20202:27 PM

## 2018-12-22 ENCOUNTER — Other Ambulatory Visit: Payer: Self-pay | Admitting: Family

## 2018-12-22 LAB — IRON AND TIBC
Iron: 46 ug/dL (ref 41–142)
Saturation Ratios: 20 % — ABNORMAL LOW (ref 21–57)
TIBC: 229 ug/dL — ABNORMAL LOW (ref 236–444)
UIBC: 183 ug/dL (ref 120–384)

## 2018-12-22 LAB — FERRITIN: Ferritin: 105 ng/mL (ref 11–307)

## 2018-12-22 LAB — VITAMIN B12: Vitamin B-12: 243 pg/mL (ref 180–914)

## 2018-12-23 ENCOUNTER — Encounter: Payer: Self-pay | Admitting: Family

## 2018-12-27 ENCOUNTER — Telehealth: Payer: Self-pay | Admitting: Family

## 2018-12-27 NOTE — Telephone Encounter (Signed)
Called swp regarding appointment for 6/19 per 6/16 sch msg

## 2018-12-28 ENCOUNTER — Ambulatory Visit: Payer: Self-pay | Admitting: Rheumatology

## 2018-12-30 ENCOUNTER — Other Ambulatory Visit: Payer: Self-pay

## 2018-12-30 ENCOUNTER — Inpatient Hospital Stay: Payer: 59

## 2018-12-30 VITALS — BP 147/99 | HR 79 | Temp 98.3°F | Resp 18

## 2018-12-30 DIAGNOSIS — N921 Excessive and frequent menstruation with irregular cycle: Secondary | ICD-10-CM

## 2018-12-30 DIAGNOSIS — D510 Vitamin B12 deficiency anemia due to intrinsic factor deficiency: Secondary | ICD-10-CM

## 2018-12-30 DIAGNOSIS — D500 Iron deficiency anemia secondary to blood loss (chronic): Secondary | ICD-10-CM

## 2018-12-30 DIAGNOSIS — K909 Intestinal malabsorption, unspecified: Secondary | ICD-10-CM

## 2018-12-30 MED ORDER — HEPARIN SOD (PORK) LOCK FLUSH 100 UNIT/ML IV SOLN
500.0000 [IU] | Freq: Once | INTRAVENOUS | Status: DC | PRN
  Filled 2018-12-30: qty 5

## 2018-12-30 MED ORDER — SODIUM CHLORIDE 0.9% FLUSH
10.0000 mL | Freq: Once | INTRAVENOUS | Status: DC | PRN
  Filled 2018-12-30: qty 10

## 2018-12-30 MED ORDER — ALTEPLASE 2 MG IJ SOLR
2.0000 mg | Freq: Once | INTRAMUSCULAR | Status: DC | PRN
  Filled 2018-12-30: qty 2

## 2018-12-30 MED ORDER — HEPARIN SOD (PORK) LOCK FLUSH 100 UNIT/ML IV SOLN
250.0000 [IU] | Freq: Once | INTRAVENOUS | Status: DC | PRN
  Filled 2018-12-30: qty 5

## 2018-12-30 MED ORDER — SODIUM CHLORIDE 0.9 % IV SOLN
INTRAVENOUS | Status: DC
  Administered 2018-12-30: 14:00:00 via INTRAVENOUS
  Filled 2018-12-30: qty 250

## 2018-12-30 MED ORDER — SODIUM CHLORIDE 0.9 % IV SOLN
200.0000 mg | Freq: Once | INTRAVENOUS | Status: AC
  Administered 2018-12-30: 200 mg via INTRAVENOUS
  Filled 2018-12-30: qty 10

## 2018-12-30 MED ORDER — SODIUM CHLORIDE 0.9% FLUSH
3.0000 mL | Freq: Once | INTRAVENOUS | Status: DC | PRN
  Filled 2018-12-30: qty 10

## 2018-12-30 NOTE — Patient Instructions (Signed)
Iron Sucrose injection What is this medicine? IRON SUCROSE (AHY ern SOO krohs) is an iron complex. Iron is used to make healthy red blood cells, which carry oxygen and nutrients throughout the body. This medicine is used to treat iron deficiency anemia in people with chronic kidney disease. This medicine may be used for other purposes; ask your health care provider or pharmacist if you have questions. COMMON BRAND NAME(S): Venofer What should I tell my health care provider before I take this medicine? They need to know if you have any of these conditions: -anemia not caused by low iron levels -heart disease -high levels of iron in the blood -kidney disease -liver disease -an unusual or allergic reaction to iron, other medicines, foods, dyes, or preservatives -pregnant or trying to get pregnant -breast-feeding How should I use this medicine? This medicine is for infusion into a vein. It is given by a health care professional in a hospital or clinic setting. Talk to your pediatrician regarding the use of this medicine in children. While this drug may be prescribed for children as young as 2 years for selected conditions, precautions do apply. Overdosage: If you think you have taken too much of this medicine contact a poison control center or emergency room at once. NOTE: This medicine is only for you. Do not share this medicine with others. What if I miss a dose? It is important not to miss your dose. Call your doctor or health care professional if you are unable to keep an appointment. What may interact with this medicine? Do not take this medicine with any of the following medications: -deferoxamine -dimercaprol -other iron products This medicine may also interact with the following medications: -chloramphenicol -deferasirox This list may not describe all possible interactions. Give your health care provider a list of all the medicines, herbs, non-prescription drugs, or dietary  supplements you use. Also tell them if you smoke, drink alcohol, or use illegal drugs. Some items may interact with your medicine. What should I watch for while using this medicine? Visit your doctor or healthcare professional regularly. Tell your doctor or healthcare professional if your symptoms do not start to get better or if they get worse. You may need blood work done while you are taking this medicine. You may need to follow a special diet. Talk to your doctor. Foods that contain iron include: whole grains/cereals, dried fruits, beans, or peas, leafy green vegetables, and organ meats (liver, kidney). What side effects may I notice from receiving this medicine? Side effects that you should report to your doctor or health care professional as soon as possible: -allergic reactions like skin rash, itching or hives, swelling of the face, lips, or tongue -breathing problems -changes in blood pressure -cough -fast, irregular heartbeat -feeling faint or lightheaded, falls -fever or chills -flushing, sweating, or hot feelings -joint or muscle aches/pains -seizures -swelling of the ankles or feet -unusually weak or tired Side effects that usually do not require medical attention (report to your doctor or health care professional if they continue or are bothersome): -diarrhea -feeling achy -headache -irritation at site where injected -nausea, vomiting -stomach upset -tiredness This list may not describe all possible side effects. Call your doctor for medical advice about side effects. You may report side effects to FDA at 1-800-FDA-1088. Where should I keep my medicine? This drug is given in a hospital or clinic and will not be stored at home. NOTE: This sheet is a summary. It may not cover all possible information. If   you have questions about this medicine, talk to your doctor, pharmacist, or health care provider.  2019 Elsevier/Gold Standard (2011-04-09 17:14:35)  

## 2019-01-04 ENCOUNTER — Ambulatory Visit: Payer: 59 | Admitting: Obstetrics & Gynecology
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
## 2019-01-20 ENCOUNTER — Inpatient Hospital Stay: Payer: 59 | Attending: Hematology & Oncology

## 2019-01-25 ENCOUNTER — Ambulatory Visit: Payer: 59 | Admitting: Rheumatology

## 2019-02-17 ENCOUNTER — Inpatient Hospital Stay: Payer: Self-pay | Attending: Hematology & Oncology

## 2019-03-24 ENCOUNTER — Ambulatory Visit: Payer: 59

## 2019-03-24 ENCOUNTER — Ambulatory Visit: Payer: 59 | Admitting: Family

## 2019-03-24 ENCOUNTER — Other Ambulatory Visit: Payer: 59

## 2020-04-05 ENCOUNTER — Encounter: Payer: Self-pay | Admitting: Family Medicine

## 2020-06-14 ENCOUNTER — Encounter: Payer: Self-pay | Admitting: Family Medicine

## 2021-07-23 ENCOUNTER — Other Ambulatory Visit: Payer: Self-pay

## 2021-07-23 ENCOUNTER — Telehealth: Payer: Self-pay | Admitting: Family Medicine

## 2021-07-23 ENCOUNTER — Telehealth (INDEPENDENT_AMBULATORY_CARE_PROVIDER_SITE_OTHER): Payer: Self-pay | Admitting: Family Medicine

## 2021-07-23 DIAGNOSIS — J019 Acute sinusitis, unspecified: Secondary | ICD-10-CM

## 2021-07-23 DIAGNOSIS — J040 Acute laryngitis: Secondary | ICD-10-CM

## 2021-07-23 MED ORDER — PREDNISONE 20 MG PO TABS: ORAL_TABLET | ORAL | 0 refills | Status: DC

## 2021-07-23 MED ORDER — DOXYCYCLINE HYCLATE 100 MG PO CAPS: 100.0000 mg | ORAL_CAPSULE | Freq: Two times a day (BID) | ORAL | 0 refills | Status: DC

## 2021-07-23 NOTE — Telephone Encounter (Signed)
Patient calling in with respiratory symptoms: Shortness of breath, chest pain, palpitations or other red words send to Triage  Does the patient have a fever over 100, cough, congestion, sore throat, runny nose, lost of taste/smell (please list symptoms that patient has)?head and chest congestion   What date did symptoms start?07-21-2021 (If over 5 days ago, pt may be scheduled for in person visit)  Have you tested for Covid in the last 5 days? Yes   If yes, was it positive []  OR negative [x] ? If positive in the last 5 days, please schedule virtual visit now. If negative, schedule for an in person OV with the next available provider if PCP has no openings. Please also let patient know they will be tested again (follow the script below)  "you will have to arrive prior to your appt time to be Covid tested. Please park in back of office at the cone & call [phone] to let the staff know you have arrived. A staff member will meet you at your car to do a rapid covid test. Once the test has resulted you will be notified by phone of your results to determine if appt will remain an in person visit or be converted to a virtual/phone visit. If you arrive less than before your appt time, your visit will be automatically converted to virtual & any recommended testing will happen AFTER the visit." Pt has virtual with dr [phone] on 07-23-2021 at 1145 am  THINGS TO REMEMBER  If no availability for virtual visit in office,  please schedule another [place] office  If no availability at another [place] office, please instruct patient that they can schedule an evisit or virtual visit through their mychart account. Visits up to 8pm  patients can be seen in office 5 days after positive COVID test
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
## 2021-07-25 ENCOUNTER — Telehealth: Payer: Self-pay | Admitting: Family Medicine

## 2021-07-25 MED ORDER — BENZONATATE 100 MG PO CAPS: ORAL_CAPSULE | ORAL | 0 refills | Status: DC

## 2021-07-25 NOTE — Telephone Encounter (Signed)
Rx sent in. Patient is aware.  

## 2021-07-25 NOTE — Telephone Encounter (Signed)
Pt states med was sent to wrong pharmacy. Medication refilled to pharmacy requested.

## 2021-07-25 NOTE — Addendum Note (Signed)
Addended by: Claudette Laws D on: 07/25/2021 05:03 PM   Modules accepted: Orders

## 2021-07-25 NOTE — Telephone Encounter (Signed)
Pt had mychart video appt with dr Caryl Never on 07-23-2021 and would like something sent to pharm for her cough  [id number] [place], [place] - [address] AT [facility] [address] [facility] [address] Phone:  [phone]  Fax:  [phone]

## 2021-07-28 ENCOUNTER — Telehealth: Payer: Self-pay | Admitting: Family Medicine

## 2021-07-28 MED ORDER — HYDROCOD POLST-CPM POLST ER 10-8 MG/5ML PO SUER: 5.0000 mL | Freq: Two times a day (BID) | ORAL | 0 refills | Status: DC | PRN

## 2021-07-28 MED ORDER — HYDROCODONE BIT-HOMATROP MBR 5-1.5 MG/5ML PO SOLN: 5.0000 mL | Freq: Four times a day (QID) | ORAL | 0 refills | Status: DC | PRN

## 2021-07-28 NOTE — Telephone Encounter (Signed)
I sent in Tussionex and the Hycodan needs to be discontinued.

## 2021-07-28 NOTE — Telephone Encounter (Signed)
Pt has virtual with dr Caryl Never on 07-23-2021 and pt is calling the benzonatate (TESSALON) 100 MG capsule is not help her cough and pt would like something else sent to pharm. Pt has been coughing a lot  [facility] #[id number] - [place], [place] - [address] AT [facility] [address] [facility] [address] Phone:  [phone]  Fax:  [phone]

## 2021-07-28 NOTE — Telephone Encounter (Signed)
Pt call and stated the cough syrup that was call in is on back order and need something else call she stated the pharmacy  is waiting for a call back.

## 2021-07-28 NOTE — Telephone Encounter (Signed)
Spoke with the patient. She is aware of Dr. Burchette's message.  °

## 2021-07-28 NOTE — Telephone Encounter (Signed)
disregard

## 2021-07-29 NOTE — Telephone Encounter (Signed)
Hycodan has been discontinued.

## 2021-07-29 NOTE — Telephone Encounter (Signed)
Spoke with the patient. She is aware that a new prescription has been sent in.

## 2021-08-25 ENCOUNTER — Encounter: Payer: Self-pay | Admitting: Family Medicine

## 2021-09-17 ENCOUNTER — Encounter: Payer: Self-pay | Admitting: Family Medicine

## 2021-09-17 ENCOUNTER — Ambulatory Visit (INDEPENDENT_AMBULATORY_CARE_PROVIDER_SITE_OTHER): Payer: Self-pay | Admitting: Family Medicine

## 2021-09-17 VITALS — BP 150/100 | HR 90 | Temp 97.9°F | Ht 66.0 in | Wt 183.1 lb

## 2021-09-17 DIAGNOSIS — D500 Iron deficiency anemia secondary to blood loss (chronic): Secondary | ICD-10-CM

## 2021-09-17 DIAGNOSIS — Z13220 Encounter for screening for lipoid disorders: Secondary | ICD-10-CM

## 2021-09-17 DIAGNOSIS — D510 Vitamin B12 deficiency anemia due to intrinsic factor deficiency: Secondary | ICD-10-CM

## 2021-09-17 DIAGNOSIS — I10 Essential (primary) hypertension: Secondary | ICD-10-CM

## 2021-09-17 DIAGNOSIS — Z1211 Encounter for screening for malignant neoplasm of colon: Secondary | ICD-10-CM

## 2021-09-17 DIAGNOSIS — Z131 Encounter for screening for diabetes mellitus: Secondary | ICD-10-CM

## 2021-09-17 DIAGNOSIS — Z1231 Encounter for screening mammogram for malignant neoplasm of breast: Secondary | ICD-10-CM

## 2021-09-17 DIAGNOSIS — F419 Anxiety disorder, unspecified: Secondary | ICD-10-CM

## 2021-09-17 DIAGNOSIS — G4700 Insomnia, unspecified: Secondary | ICD-10-CM

## 2021-09-17 LAB — COMPREHENSIVE METABOLIC PANEL
ALT: 15 U/L (ref 0–35)
AST: 25 U/L (ref 0–37)
Albumin: 4 g/dL (ref 3.5–5.2)
Alkaline Phosphatase: 73 U/L (ref 39–117)
BUN: 7 mg/dL (ref 6–23)
CO2: 27 mEq/L (ref 19–32)
Calcium: 9.4 mg/dL (ref 8.4–10.5)
Chloride: 101 mEq/L (ref 96–112)
Creatinine, Ser: 0.68 mg/dL (ref 0.40–1.20)
GFR: 103.8 mL/min (ref >60.00)
Glucose, Bld: 85 mg/dL (ref 70–99)
Potassium: 3.3 mEq/L — ABNORMAL LOW (ref 3.5–5.1)
Sodium: 137 mEq/L (ref 135–145)
Total Bilirubin: 0.7 mg/dL (ref 0.2–1.2)
Total Protein: 8.1 g/dL (ref 6.0–8.3)

## 2021-09-17 LAB — CBC WITH DIFFERENTIAL/PLATELET
Basophils Absolute: 0 10*3/uL (ref 0.0–0.1)
Basophils Relative: 1.3 % (ref 0.0–3.0)
Eosinophils Absolute: 0.1 10*3/uL (ref 0.0–0.7)
Eosinophils Relative: 3.3 % (ref 0.0–5.0)
HCT: 34.2 % — ABNORMAL LOW (ref 36.0–46.0)
Hemoglobin: 10.8 g/dL — ABNORMAL LOW (ref 12.0–15.0)
Lymphocytes Relative: 54.5 % — ABNORMAL HIGH (ref 12.0–46.0)
Lymphs Abs: 1.4 10*3/uL (ref 0.7–4.0)
MCHC: 31.7 g/dL (ref 30.0–36.0)
MCV: 75.2 fl — ABNORMAL LOW (ref 78.0–100.0)
Monocytes Absolute: 0.3 10*3/uL (ref 0.1–1.0)
Monocytes Relative: 9.6 % (ref 3.0–12.0)
Neutro Abs: 0.8 10*3/uL — ABNORMAL LOW (ref 1.4–7.7)
Neutrophils Relative %: 31.3 % — ABNORMAL LOW (ref 43.0–77.0)
Platelets: 330 10*3/uL (ref 150.0–400.0)
RBC: 4.55 Mil/uL (ref 3.87–5.11)
RDW: 30.5 % — ABNORMAL HIGH (ref 11.5–15.5)
WBC: 2.6 10*3/uL — ABNORMAL LOW (ref 4.0–10.5)

## 2021-09-17 LAB — IBC + FERRITIN
Ferritin: 20.8 ng/mL (ref 10.0–291.0)
Iron: 100 ug/dL (ref 42–145)
Saturation Ratios: 28.8 % (ref 20.0–50.0)
TIBC: 347.2 ug/dL (ref 250.0–450.0)
Transferrin: 248 mg/dL (ref 212.0–360.0)

## 2021-09-17 LAB — VITAMIN B12: Vitamin B-12: 198 pg/mL — ABNORMAL LOW (ref 211–911)

## 2021-09-17 LAB — LIPID PANEL
Cholesterol: 213 mg/dL — ABNORMAL HIGH (ref 0–200)
HDL: 51.3 mg/dL (ref >39.00)
LDL Cholesterol: 144 mg/dL — ABNORMAL HIGH (ref 0–99)
NonHDL: 161.81
Total CHOL/HDL Ratio: 4
Triglycerides: 88 mg/dL (ref 0.0–149.0)
VLDL: 17.6 mg/dL (ref 0.0–40.0)

## 2021-09-17 LAB — FERRITIN: Ferritin: 20.8 ng/mL (ref 10.0–291.0)

## 2021-09-17 LAB — TSH: TSH: 1.46 u[IU]/mL (ref 0.35–5.50)

## 2021-09-17 LAB — FOLATE: Folate: >24.2 ng/mL (ref >5.9)

## 2021-09-17 MED ORDER — CITALOPRAM HYDROBROMIDE 10 MG PO TABS: 10.0000 mg | ORAL_TABLET | Freq: Every day | ORAL | 2 refills | Status: DC

## 2021-09-17 MED ORDER — TELMISARTAN 20 MG PO TABS: 20.0000 mg | ORAL_TABLET | Freq: Every day | ORAL | 1 refills | Status: DC

## 2021-09-17 NOTE — Patient Instructions (Signed)
*  bring home cuff with you to next office visit.  ?
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
## 2021-09-20 LAB — HOMOCYSTEINE: Homocysteine: 10.4 umol/L — ABNORMAL HIGH (ref <10.4)

## 2021-09-20 LAB — METHYLMALONIC ACID, SERUM: Methylmalonic Acid, Quant: 130 nmol/L (ref 87–318)

## 2021-10-13 ENCOUNTER — Telehealth: Payer: Self-pay | Admitting: Family Medicine

## 2021-10-13 DIAGNOSIS — F419 Anxiety disorder, unspecified: Secondary | ICD-10-CM

## 2021-10-13 NOTE — Telephone Encounter (Signed)
If not having negative side effects with it, then please increase to 20mg  (can double up on what she has of 10mg ). If any negative side effects let me know. Suggest follow up in may. ?

## 2021-10-13 NOTE — Telephone Encounter (Signed)
Patient called in stating that citalopram (CELEXA) 10 MG tablet [[id number]]  isn't working for her at all. ? ?Please advise. ?

## 2021-10-14 MED ORDER — CITALOPRAM HYDROBROMIDE 20 MG PO TABS: 20.0000 mg | ORAL_TABLET | Freq: Every day | ORAL | 1 refills | Status: DC

## 2021-10-14 NOTE — Telephone Encounter (Signed)
Patient informed of the message below.  Patient denies any negative side effects as she states she only feels the current dose is not helping decrease anxiety.  Patient is aware a new Rx will be sent to [facility] as she does not have enough to double the dose and a follow up appt was scheduled for 5/8. ?

## 2021-10-28 ENCOUNTER — Inpatient Hospital Stay: Admission: RE | Admit: 2021-10-28 | Payer: Self-pay | Source: Ambulatory Visit

## 2021-11-07 ENCOUNTER — Ambulatory Visit
Admission: RE | Admit: 2021-11-07 | Discharge: 2021-11-07 | Disposition: A | Discharge status: Home / Self Care | Payer: PRIVATE HEALTH INSURANCE | Source: Ambulatory Visit | Attending: Family Medicine | Admitting: Family Medicine

## 2021-11-07 ENCOUNTER — Encounter: Payer: Self-pay | Admitting: Hematology & Oncology

## 2021-11-07 DIAGNOSIS — Z1231 Encounter for screening mammogram for malignant neoplasm of breast: Secondary | ICD-10-CM

## 2021-11-14 ENCOUNTER — Ambulatory Visit
Admission: RE | Admit: 2021-11-14 | Discharge: 2021-11-14 | Disposition: A | Discharge status: Home / Self Care | Payer: PRIVATE HEALTH INSURANCE | Source: Ambulatory Visit | Attending: Family Medicine | Admitting: Family Medicine

## 2021-11-17 ENCOUNTER — Encounter: Payer: Self-pay | Admitting: Family Medicine

## 2021-11-17 ENCOUNTER — Ambulatory Visit (INDEPENDENT_AMBULATORY_CARE_PROVIDER_SITE_OTHER): Payer: Self-pay | Admitting: Family Medicine

## 2021-11-17 ENCOUNTER — Other Ambulatory Visit: Payer: Self-pay | Admitting: Family Medicine

## 2021-11-17 VITALS — BP 132/88 | HR 76 | Temp 97.7°F | Ht 66.0 in | Wt 182.3 lb

## 2021-11-17 DIAGNOSIS — D510 Vitamin B12 deficiency anemia due to intrinsic factor deficiency: Secondary | ICD-10-CM

## 2021-11-17 DIAGNOSIS — F419 Anxiety disorder, unspecified: Secondary | ICD-10-CM

## 2021-11-17 DIAGNOSIS — I10 Essential (primary) hypertension: Secondary | ICD-10-CM

## 2021-11-17 DIAGNOSIS — G4700 Insomnia, unspecified: Secondary | ICD-10-CM

## 2021-11-17 MED ORDER — TRAZODONE HCL 50 MG PO TABS: 50.0000 mg | ORAL_TABLET | Freq: Every evening | ORAL | 3 refills | Status: DC | PRN

## 2021-11-17 MED ORDER — TELMISARTAN 40 MG PO TABS: 40.0000 mg | ORAL_TABLET | Freq: Every day | ORAL | 1 refills | Status: DC

## 2021-11-17 MED ORDER — SPACER/AERO-HOLDING CHAMBERS DEVI: 1.0000 | Freq: Every day | 0 refills | Status: DC | PRN

## 2021-11-17 MED ORDER — ALBUTEROL SULFATE HFA 108 (90 BASE) MCG/ACT IN AERS: 2.0000 | INHALATION_SPRAY | Freq: Four times a day (QID) | RESPIRATORY_TRACT | 0 refills | Status: DC | PRN

## 2021-11-17 NOTE — Patient Instructions (Addendum)
*  try antihistamine (zyrtec, claritin or allegra) - without the "D" and see if this helps with allergies. Use albuterol inhaler 2 puffs with 4 breaths in chamber as needed.  ? ?*increase the citalopram to 40mg  daily. Update me in 1-2 weeks with progress on this and trazodone.  ? ?*if 1 of the trazodone tablets doesn't work for sleep, then try taking 2 the next night.  ? ?*we are going to increase your micardis to 40mg  daily for betterbp control. ?

## 2021-11-17 NOTE — Progress Notes (Signed)
?Paula Sherman ?DOB: 1976-05-27 ?Encounter date: 11/17/2021 ? ?This is a 46 y.o. female who presents with ?Chief Complaint  ?Patient presents with  ? Follow-up  ? ? ?History of present illness: ?Last visit was 09/17/21. At that time we discussed multiple concerns including: ? ?Insomnia: sleep is not going well. Lastnight just got 3 hours. If tv on she can rest a little, but otherwise she cannot fall asleep. Was up all night Saturday night.  ? ?Anxiety/stress: we added citalopram 10mg  daily. She was not getting benefit with this so we increased to 20mg daily 4/3. Just not noticed any difference. She is back in the gym. That has been good for her. She is still over-committed and taking care of all of family.  ? ?HTN: we started micardis because she had taken in the past and pressures were high. Not checking at home. No issues with meds.  ? ?B12 and folate deficiency: she did start taking these and started potassium.  ? ?Hyperlipidemia - lifestyle changes recomended ? ?Did complete mammogram 11/14/21: normal ?Did not complete cologuard ? ? ?Allergies  ?Allergen Reactions  ? Penicillins Anaphylaxis  ? Latex Rash  ? ?Current Meds  ?Medication Sig  ? albuterol (VENTOLIN HFA) 108 (90 Base) MCG/ACT inhaler Inhale 2 puffs into the lungs every 6 (six) hours as needed for wheezing or shortness of breath.  ? citalopram (CELEXA) 20 MG tablet Take 1 tablet (20 mg total) by mouth daily.  ? Spacer/Aero-Holding Chambers DEVI 1 Device by Does not apply route daily as needed.  ? telmisartan (MICARDIS) 40 MG tablet Take 1 tablet (40 mg total) by mouth daily.  ? traZODone (DESYREL) 50 MG tablet Take 1 tablet (50 mg total) by mouth at bedtime as needed for sleep.  ? [DISCONTINUED] telmisartan (MICARDIS) 20 MG tablet Take 1 tablet (20 mg total) by mouth daily.  ? ? ?Review of Systems  ?Constitutional:  Negative for chills, fatigue and fever.  ?Respiratory:  Negative for cough, chest tightness, shortness of breath and wheezing.    ?Cardiovascular:  Negative for chest pain, palpitations and leg swelling.  ? ?Objective: ? ?BP 132/88 (BP Location: Left Arm, Patient Position: Sitting, Cuff Size: Large)   Pulse 76   Temp 97.7 ?F (36.5 ?C) (Oral)   Ht 5\' 6"  (1.676 m)   Wt 182 lb 4.8 oz (82.7 kg)   LMP 10/15/2021 (Exact Date)   BMI 29.42 kg/m?   Weight: 182 lb 4.8 oz (82.7 kg)  ? ?BP Readings from Last 3 Encounters:  ?11/17/21 132/88  ?09/17/21 (!) 150/100  ?10/26/17 128/79  ? ?Wt Readings from Last 3 Encounters:  ?11/17/21 182 lb 4.8 oz (82.7 kg)  ?09/17/21 183 lb 1.6 oz (83.1 kg)  ?10/25/17 200 lb (90.7 kg)  ? ? ?Physical Exam ?Constitutional:   ?   General: She is not in acute distress. ?   Appearance: She is well-developed.  ?Cardiovascular:  ?   Rate and Rhythm: Normal rate and regular rhythm.  ?   Heart sounds: Normal heart sounds. No murmur heard. ?  No friction rub.  ?Pulmonary:  ?   Effort: Pulmonary effort is normal. No respiratory distress.  ?   Breath sounds: Wheezing (exp wheeze right side chest) present. No rales.  ?Musculoskeletal:  ?   Right lower leg: No edema.  ?   Left lower leg: No edema.  ?Neurological:  ?   Mental Status: She is alert and oriented to person, place, and time.  ?Psychiatric:     ?  Behavior: Behavior normal.  ? ? ?Assessment/Plan ? ?1. Insomnia, unspecified type ?Trazodone trial. If this is not effective would consider low dose seroquel in place for sleep and anxiety treatment.  ? ?2. Anxiety ?Increase citralopram to 40mg  daily; if not noting improvement will wean back on. ? ?3. Hypertension, unspecified type ?We are going to increase micardis for better bp control.  ? ?4. Pernicious anemia ?She is supplementing b12/folate.  ? ? ?Return for she will update me through mychart in 1-2 weeks. ? ? ? ? ? , MD ?

## 2021-11-18 ENCOUNTER — Telehealth: Payer: Self-pay | Admitting: Family Medicine

## 2021-11-18 NOTE — Telephone Encounter (Signed)
Pt seeking lesser expensive options for the following medications citalopram (CELEXA) 20 MG tablet, , traZODone (DESYREL) 50 MG tablet.  ?[id number] [place], [place] - [address] AT [facility] [address] [facility] [address] Phone:  [phone]  ?Fax:  [phone]  ?  ? ?

## 2021-11-19 ENCOUNTER — Other Ambulatory Visit: Payer: Self-pay | Admitting: Family Medicine

## 2021-11-19 MED ORDER — TRAZODONE HCL 50 MG PO TABS: 50.0000 mg | ORAL_TABLET | Freq: Every evening | ORAL | 3 refills | Status: DC | PRN

## 2021-11-19 MED ORDER — CITALOPRAM HYDROBROMIDE 20 MG PO TABS
ORAL_TABLET | ORAL | 1 refills | Status: DC
Start: 2021-11-19 — End: 2022-02-13

## 2021-11-19 MED ORDER — ALBUTEROL SULFATE HFA 108 (90 BASE) MCG/ACT IN AERS: 2.0000 | INHALATION_SPRAY | Freq: Four times a day (QID) | RESPIRATORY_TRACT | 0 refills | Status: DC | PRN

## 2021-11-19 MED ORDER — TELMISARTAN 40 MG PO TABS: 40.0000 mg | ORAL_TABLET | Freq: Every day | ORAL | 1 refills | Status: DC

## 2021-11-19 NOTE — Telephone Encounter (Signed)
Patient informed of the message below.  Patient requested all of her Rxs be sent to [facility] also and is aware the previous Rxs were sent.  Message sent to PCP for Trazodone. ?

## 2021-11-19 NOTE — Telephone Encounter (Signed)
Both of these medications are $4 list meds, so maybe if insurance is making her pay a lot for these we just switch pharmacy to [facility]. Neither should be more than $4. ?

## 2021-12-15 ENCOUNTER — Ambulatory Visit: Payer: PRIVATE HEALTH INSURANCE | Admitting: Family

## 2021-12-19 NOTE — Progress Notes (Unsigned)
ACUTE VISIT Chief Complaint  Patient presents with   Follow-up    Able to fall asleep, but still waking up. Trazadone has not been helping.    inhaler    Never received the spacer for her inhaler, still having some wheezing.    HPI: [name] is a 46 y.o. female with hx of pernicious anemia and iron def anemia here today with above complaints. Insomnia: Addressed last with former PCP on 11/17/21, Trazodone 50 mg was recommended but is is not helping. Problem has been going on a for a few months, sleeping about 3-4 hours. Wakes up a few times throughout the night and may take hours to go back to sleep. + Fatigue., She has a lot of stress, family issues. She doe snot have much problem falling asleep.  Lab Results  Component Value Date   TSH 1.46 09/17/2021   Anxiety: She is on Celexa 20 mg daily, increased last visit with PCP on 10/13/21. SHe has not seen psychiatrist before and has not tried CBT.     12/22/2021    3:22 PM 11/17/2021   10:09 AM 09/17/2021   11:02 AM 09/07/2018   11:52 AM  Depression screen PHQ 2/9  Decreased Interest 1 0 0   Down, Depressed, Hopeless 0 0 1   PHQ - 2 Score 1 0 1   Altered sleeping 3 3 3    Tired, decreased energy 0 1 1   Change in appetite 1 3 1    Feeling bad or failure about yourself  0 1 1   Trouble concentrating 3 3 1    Moving slowly or fidgety/restless 1 1 3    Suicidal thoughts 0 0 0   PHQ-9 Score 9 12 11    Difficult doing work/chores Somewhat difficult Somewhat difficult       Information is confidential and restricted. Go to Review Flowsheets to unlock data.   Also c/o SOB,cough,and wheezing intermittent for a month. Negative for heartburn. No orthopnea,PND,or edema.  Wheezing  This is a new problem. The current episode started more than 1 month ago. The problem occurs intermittently. The problem has been unchanged. Associated symptoms include coughing and shortness of breath. Pertinent negatives include no abdominal  pain, chest pain, chills, coryza, ear pain, fever, headaches, hemoptysis, neck pain, sore throat, sputum production, swollen glands or vomiting. The symptoms are aggravated by lying flat. She has tried beta agonist inhalers for the symptoms. The treatment provided mild relief. There is no history of asthma, bronchiolitis, CAD, chronic lung disease, COPD, DVT or heart failure.  Albuterol inh prescribed in the past helps temporarily. Cough and wheezing also interferes with sleep. She keeps cough drops in her purse. She states that an spacer was not at her pharmacy.  No hx of tobacco use or asthma. Grandchild with hx of asthma.  Review of Systems  Constitutional:  Positive for fatigue. Negative for chills, diaphoresis and fever.  HENT:  Negative for ear pain and sore throat.   Respiratory:  Positive for cough, shortness of breath and wheezing. Negative for hemoptysis and sputum production.   Cardiovascular:  Negative for chest pain.  Gastrointestinal:  Negative for abdominal pain and vomiting.  Endocrine: Negative for cold intolerance and heat intolerance.  Musculoskeletal:  Negative for neck pain.  Allergic/Immunologic: Positive for environmental allergies.  Neurological:  Negative for syncope, weakness and headaches.  Psychiatric/Behavioral:  Positive for sleep disturbance. Negative for confusion and hallucinations. The patient is nervous/anxious.   Rest see pertinent positives and negatives per  HPI.  Current Outpatient Medications on File Prior to Visit  Medication Sig Dispense Refill   albuterol (VENTOLIN HFA) 108 (90 Base) MCG/ACT inhaler Inhale 2 puffs into the lungs every 6 (six) hours as needed for wheezing or shortness of breath. 8 g 0   citalopram (CELEXA) 20 MG tablet TAKE 1 TABLET(20 MG) BY MOUTH DAILY 90 tablet 1   telmisartan (MICARDIS) 40 MG tablet Take 1 tablet (40 mg total) by mouth daily. 90 tablet 1   No current facility-administered medications on file prior to visit.    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   Lupus (Emily)    Menometrorrhagia 09/12/2018   Pernicious anemia 09/12/2018   Allergies  Allergen Reactions   Penicillins Anaphylaxis   Latex Rash    Social History   Socioeconomic History   Marital status: Married    Spouse name: Not on file   Number of children: Not on file   Years of education: Not on file   Highest education level: Not on file  Occupational History   Not on file  Tobacco Use   Smoking status: Never   Smokeless tobacco: Never  Vaping Use   Vaping Use: Never used  Substance and Sexual Activity   Alcohol use: Not Currently   Drug use: Not Currently   Sexual activity: Not Currently  Other Topics Concern   Not on file  Social History Narrative   Not on file   Social Determinants of Health   Financial Resource Strain: Not on file  Food Insecurity: Not on file  Transportation Needs: Not on file  Physical Activity: Not on file  Stress: Not on file  Social Connections: Not on file   Vitals:   12/22/21 1515  BP: 136/80  Resp: 16  Temp: 98.5 F (36.9 C)   Body mass index is 28.81 kg/m.  Physical Exam Vitals and nursing note reviewed.  Constitutional:      General: She is not in acute distress.    Appearance: She is well-developed. She is not ill-appearing.  HENT:     Head: Normocephalic and atraumatic.     Nose:     Right Turbinates: Enlarged.     Left Turbinates: Enlarged.     Mouth/Throat:     Mouth: Mucous membranes are moist.     Pharynx: Oropharynx is clear.  Eyes:     Conjunctiva/sclera: Conjunctivae normal.  Cardiovascular:     Rate and Rhythm: Normal rate and regular rhythm.     Heart sounds: No murmur heard. Pulmonary:     Effort: Pulmonary effort is normal. No respiratory distress.     Breath sounds: Normal breath sounds. No stridor.  Abdominal:     Palpations: Abdomen is soft. There is no  mass.     Tenderness: There is no abdominal tenderness.  Musculoskeletal:     Cervical back: No edema or erythema. No muscular tenderness.  Lymphadenopathy:     Cervical: No cervical adenopathy.  Skin:    General: Skin is warm.     Findings: No erythema or rash.  Neurological:     Mental Status: She is alert and oriented to person, place, and time.  Psychiatric:        Mood and Affect: Mood is anxious.     Comments: Well groomed, good eye contact.   ASSESSMENT AND PLAN:  PaulaShirley was seen today for follow-up and inhaler.  Diagnoses  and all orders for this visit: Orders Placed This Encounter  Procedures   DG Chest 2 View   SOB (shortness of breath) We discussed differential Dx's. No associated CP or diaphoresis. Asthma vs COPD. CXR ordered today. Instructed about warning signs.  -     fluticasone-salmeterol (ADVAIR HFA) 115-21 MCG/ACT inhaler; Inhale 2 puffs into the lungs 2 (two) times daily.  Insomnia, unspecified type Problem is not well controlled. Anxiety seems to be a contributing factors. Mood disorder to be considered. Good sleep hygiene discussed. Discontinue Trazodone. She agrees with trying Doxepin 10 mg at bedtime.  F/U with new PCP in 4-5 weeks, before if needed.  -     doxepin (SINEQUAN) 10 MG capsule; Take 1 capsule (10 mg total) by mouth at bedtime as needed.  Reactive airway disease without asthma Advair 115-21 mcg 2 puff bid added today. Spacer re-sent to her pharmacy. Continue Albuterol inh 2 puff qid prn. Instructed about warning signs. F/U in 4-5 weeks.  -     Spacer/Aero-Holding Chambers DEVI; 1 Device by Does not apply route daily as needed. -     fluticasone-salmeterol (ADVAIR HFA) 115-21 MCG/ACT inhaler; Inhale 2 puffs into the lungs 2 (two) times daily.  Anxiety disorder, unspecified type Improved with Celexa 20 mg daily. Doxepin added today for insomnia may also help. CBT recommended, flyer with contact information given.  -      doxepin (SINEQUAN) 10 MG capsule; Take 1 capsule (10 mg total) by mouth at bedtime as needed.  Return in about 5 weeks (around 01/26/2022) for With new PCP or Ms. Padanda .  Marcelus Dubberly G. Martinique, MD  [facility]. [place] office.

## 2021-12-22 ENCOUNTER — Ambulatory Visit (INDEPENDENT_AMBULATORY_CARE_PROVIDER_SITE_OTHER): Payer: BC Managed Care – PPO

## 2021-12-22 ENCOUNTER — Ambulatory Visit (INDEPENDENT_AMBULATORY_CARE_PROVIDER_SITE_OTHER): Payer: BC Managed Care – PPO | Admitting: Family Medicine

## 2021-12-22 ENCOUNTER — Encounter: Payer: Self-pay | Admitting: Family Medicine

## 2021-12-22 VITALS — BP 136/80 | Temp 98.5°F | Resp 16 | Ht 66.0 in | Wt 178.5 lb

## 2021-12-22 DIAGNOSIS — J989 Respiratory disorder, unspecified: Secondary | ICD-10-CM | POA: Diagnosis not present

## 2021-12-22 DIAGNOSIS — R0602 Shortness of breath: Secondary | ICD-10-CM | POA: Diagnosis not present

## 2021-12-22 DIAGNOSIS — R059 Cough, unspecified: Secondary | ICD-10-CM | POA: Diagnosis not present

## 2021-12-22 DIAGNOSIS — F419 Anxiety disorder, unspecified: Secondary | ICD-10-CM | POA: Diagnosis not present

## 2021-12-22 DIAGNOSIS — G4700 Insomnia, unspecified: Secondary | ICD-10-CM

## 2021-12-22 DIAGNOSIS — R062 Wheezing: Secondary | ICD-10-CM | POA: Diagnosis not present

## 2021-12-22 MED ORDER — DOXEPIN HCL 10 MG PO CAPS: 10.0000 mg | ORAL_CAPSULE | Freq: Every evening | ORAL | 0 refills | Status: DC | PRN

## 2021-12-22 MED ORDER — SPACER/AERO-HOLDING CHAMBERS DEVI: 1.0000 | Freq: Every day | 0 refills | Status: AC | PRN

## 2021-12-22 MED ORDER — FLUTICASONE-SALMETEROL 115-21 MCG/ACT IN AERO: 2.0000 | INHALATION_SPRAY | Freq: Two times a day (BID) | RESPIRATORY_TRACT | 1 refills | Status: DC

## 2021-12-22 NOTE — Patient Instructions (Addendum)
A few things to remember from today's visit:  Anxiety disorder, unspecified type - Plan: doxepin (SINEQUAN) 10 MG capsule  Insomnia, unspecified type - Plan: doxepin (SINEQUAN) 10 MG capsule  Reactive airway disease without asthma - Plan: fluticasone-salmeterol (ADVAIR HFA) 115-21 MCG/ACT inhaler  SOB (shortness of breath) - Plan: DG Chest 2 View, fluticasone-salmeterol (ADVAIR HFA) 115-21 MCG/ACT inhaler  If you need refills please call your pharmacy. Do not use My Chart to request refills or for acute issues that need immediate attention.   Advair 2 puff 2 times daily, swish after use. Use spacer if possible. Stop Trazodone. Doxepin 10 mg at bedtime to help with sleep. Adequate sleep hygiene. Psychotherapy may also help.   Please be sure medication list is accurate. If a new problem present, please set up appointment sooner than planned today.

## 2022-02-02 ENCOUNTER — Telehealth: Payer: Self-pay | Admitting: Family Medicine

## 2022-02-02 NOTE — Telephone Encounter (Signed)
Pt states she has issues taking the doxepin (SINEQUAN) 10 MG capsule states she cannot function when she takes it, very sleepy, dragging around. Requests to go back to the trazadone. Requests refill of citalopram (CELEXA) 20 MG tablet   [id number] - [place] ([place]), [place] - [address] Phone:  [phone]  Fax:  [phone]

## 2022-02-02 NOTE — Telephone Encounter (Signed)
Pt is aware med refill can take up to 3 business days 

## 2022-02-03 ENCOUNTER — Other Ambulatory Visit: Payer: Self-pay | Admitting: Family Medicine

## 2022-02-03 DIAGNOSIS — G4700 Insomnia, unspecified: Secondary | ICD-10-CM

## 2022-02-03 MED ORDER — TRAZODONE HCL 50 MG PO TABS: 50.0000 mg | ORAL_TABLET | Freq: Every evening | ORAL | 3 refills | Status: DC | PRN

## 2022-02-03 NOTE — Telephone Encounter (Signed)
Spoke with the patient and informed her of the message below.  Appt was scheduled for 8/4.

## 2022-02-12 ENCOUNTER — Other Ambulatory Visit: Payer: Self-pay | Admitting: Family Medicine

## 2022-02-12 DIAGNOSIS — J989 Respiratory disorder, unspecified: Secondary | ICD-10-CM

## 2022-02-12 DIAGNOSIS — R0602 Shortness of breath: Secondary | ICD-10-CM

## 2022-02-13 ENCOUNTER — Encounter: Payer: Self-pay | Admitting: Family Medicine

## 2022-02-13 ENCOUNTER — Ambulatory Visit (INDEPENDENT_AMBULATORY_CARE_PROVIDER_SITE_OTHER): Payer: PRIVATE HEALTH INSURANCE | Admitting: Family Medicine

## 2022-02-13 VITALS — BP 158/100 | HR 70 | Temp 97.5°F | Ht 66.0 in | Wt 193.9 lb

## 2022-02-13 DIAGNOSIS — E538 Deficiency of other specified B group vitamins: Secondary | ICD-10-CM | POA: Diagnosis not present

## 2022-02-13 DIAGNOSIS — M545 Low back pain, unspecified: Secondary | ICD-10-CM | POA: Diagnosis not present

## 2022-02-13 DIAGNOSIS — I10 Essential (primary) hypertension: Secondary | ICD-10-CM | POA: Diagnosis not present

## 2022-02-13 DIAGNOSIS — F5104 Psychophysiologic insomnia: Secondary | ICD-10-CM | POA: Diagnosis not present

## 2022-02-13 DIAGNOSIS — Z1211 Encounter for screening for malignant neoplasm of colon: Secondary | ICD-10-CM

## 2022-02-13 DIAGNOSIS — F419 Anxiety disorder, unspecified: Secondary | ICD-10-CM

## 2022-02-13 LAB — VITAMIN B12: Vitamin B-12: >1500 pg/mL — ABNORMAL HIGH (ref 211–911)

## 2022-02-13 MED ORDER — CITALOPRAM HYDROBROMIDE 40 MG PO TABS: 40.0000 mg | ORAL_TABLET | Freq: Every day | ORAL | 3 refills | Status: DC

## 2022-02-13 MED ORDER — DICLOFENAC SODIUM 75 MG PO TBEC: 75.0000 mg | DELAYED_RELEASE_TABLET | Freq: Two times a day (BID) | ORAL | 0 refills | Status: DC

## 2022-02-13 NOTE — Assessment & Plan Note (Signed)
Patient needs a new level drawn, she continues to take daily supplements of B12 and iron.

## 2022-02-13 NOTE — Patient Instructions (Signed)
Increase Celexa to 40 mg daily (may take 2 tablets of the 20 mg to equal 40 mg)   Start checking blood pressure at home while resting, your goal blood pressure should be less than 140/90.

## 2022-02-13 NOTE — Progress Notes (Signed)
Established Patient Office Visit  Subjective   Patient ID: Paula Sherman, female    DOB: 1976/05/14  Age: 46 y.o. MRN: [id number]  Chief Complaint  Patient presents with   Establish Care    Pt is here for follow up on anxiety and insomnia  Anxiety - celexa 20 mg has been working somewhat for the agitation, states that she would like to increase the dose to help it work better.   Insomnia - patient states that she is taking the trazodone at night, states she is sleeping slightly better, getting 3 hours of sleep at night. She lays down around 9 pm then it takes about 2 hours to fall asleep with the medication. Patient is also reporting some back pain across the lower part of her hips, states that she has to change position frequently at night to try and relieve the pressure. States she usually takes OTC medications to help relieve the pain, states that tylenol stopped working for her.    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     Objective:     BP (!) 158/100 (BP Location: Right Arm, Cuff Size: Large)   Pulse 70   Temp (!) 97.5 F (36.4 C) (Oral)   Ht 5\' 6"  (1.676 m)   Wt 193 lb 14.4 oz (88 kg)   LMP 01/30/2022 (Exact Date)   BMI 31.30 kg/m    Physical Exam Vitals reviewed.  Constitutional:      Appearance: Normal appearance. She is well-groomed and normal weight.  HENT:     Head: Normocephalic and atraumatic.  Eyes:     Extraocular Movements: Extraocular movements intact.     Pupils: Pupils are equal, round, and reactive to light.  Cardiovascular:     Heart sounds: S1 normal and S2 normal.  Pulmonary:      Breath sounds: Normal air entry.  Musculoskeletal:        General: Normal range of motion.     Right lower leg: No edema.     Left lower leg: No edema.  Skin:    General: Skin is warm and dry.  Neurological:     Mental Status: She is alert and oriented to person, place, and time. Mental status is at baseline.     Gait: Gait is intact.  Psychiatric:        Mood and Affect: Mood and affect normal.        Speech: Speech normal.        Behavior: Behavior normal.        Judgment: Judgment normal.     No results found for any visits on 02/13/22.    The 10-year ASCVD risk score (Arnett DK, et al., 2019) is: 8.2%    Assessment & Plan:   Problem List Items Addressed This Visit       Cardiovascular and Mediastinum   HTN (hypertension) (Chronic)    I rechecked her BP in office and it was higher than when she came in, could be due to talking about her anxiety/ insomnia. She does not have a BP cuff at home and I advised she get one to  check her BP regularly at home.  she will be returning to the office soon for a pap smear so we will recheck her BP at that time. If it remains elevated then we will discuss increasing her medication. For now will continue  Current hypertension medications:       Sig   telmisartan (MICARDIS) 40 MG tablet (Taking) Take 1 tablet (40 mg total) by mouth daily.         Other   Anxiety - Primary (Chronic)    Relevant Medications   Improved with the 20 mg of celexa however pt is still having breakthrough symptoms, will increase the dose to 40 mg today. Will see her back in 1 month to re-evaluate her symptoms.  citalopram (CELEXA) 40 MG tablet daily   Chronic insomnia (Chronic)   B12 deficiency (Chronic)    Patient needs a new level drawn, she continues to take daily supplements of B12 and iron.      Relevant Orders   Vitamin B12   Other Visit Diagnoses     Midline low back pain without sciatica, unspecified chronicity       Relevant Medications    diclofenac (VOLTAREN) 75 MG EC tablet, will add this for relief of her MSK back pain. Hopefully treatment of her back pain will reduce nighttime awakenings.    Screening for colon cancer       Relevant Orders   Cologuard       Return in about 4 weeks (around 03/13/2022) for well woman visit for pap smear.    Karie Georges, MD

## 2022-02-13 NOTE — Assessment & Plan Note (Signed)
I rechecked her BP in office and it was higher than when she came in, could be due to talking about her anxiety/ insomnia. She does not have a BP cuff at home and I advised she get one to check her BP regularly at home.  she will be returning to the office soon for a pap smear so we will recheck her BP at that time. If it remains elevated then we will discuss increasing her medication. For now will continue  Current hypertension medications:      Sig   telmisartan (MICARDIS) 40 MG tablet (Taking) Take 1 tablet (40 mg total) by mouth daily.

## 2022-02-16 NOTE — Assessment & Plan Note (Signed)
Improved with the 20 mg of celexa however pt is still having breakthrough symptoms, will increase the dose to 40 mg today. Will see her back in 1 month to re-evaluate her symptoms.

## 2022-02-16 NOTE — Assessment & Plan Note (Signed)
Patient is currently on trazodone at night, states it is only mildly improving her symptoms. We are increasing her celexa to 40 mg daily -- this may help with her sleeping issues as well. For now will continue th trazodone and will wait to see how the celexa affects her sleep patterns.

## 2022-02-18 MED ORDER — TRAZODONE HCL 100 MG PO TABS: 100.0000 mg | ORAL_TABLET | Freq: Every day | ORAL | 1 refills | Status: DC

## 2022-02-18 NOTE — Progress Notes (Signed)
Please have patient increase the trazodone to 100 mg at bedtime. I will call in a new rx for her

## 2022-02-18 NOTE — Addendum Note (Signed)
Addended by: Karie Georges on: 02/18/2022 02:25 PM   Modules accepted: Orders

## 2022-03-02 ENCOUNTER — Telehealth: Payer: Self-pay | Admitting: Family Medicine

## 2022-03-02 DIAGNOSIS — M545 Low back pain, unspecified: Secondary | ICD-10-CM

## 2022-03-02 MED ORDER — DICLOFENAC SODIUM 75 MG PO TBEC: 75.0000 mg | DELAYED_RELEASE_TABLET | Freq: Two times a day (BID) | ORAL | 0 refills | Status: DC

## 2022-03-02 NOTE — Telephone Encounter (Signed)
Requesting refill diclofenac (VOLTAREN) 75 MG EC tablet

## 2022-03-02 NOTE — Telephone Encounter (Signed)
Ok to refill 

## 2022-03-02 NOTE — Telephone Encounter (Signed)
Rx done. 

## 2022-03-17 ENCOUNTER — Ambulatory Visit (INDEPENDENT_AMBULATORY_CARE_PROVIDER_SITE_OTHER): Payer: PRIVATE HEALTH INSURANCE | Admitting: Family Medicine

## 2022-03-17 ENCOUNTER — Encounter: Payer: Self-pay | Admitting: Family Medicine

## 2022-03-17 VITALS — BP 118/90 | HR 76 | Temp 98.1°F | Ht 67.5 in | Wt 194.5 lb

## 2022-03-17 DIAGNOSIS — M545 Low back pain, unspecified: Secondary | ICD-10-CM | POA: Diagnosis not present

## 2022-03-17 DIAGNOSIS — F419 Anxiety disorder, unspecified: Secondary | ICD-10-CM | POA: Diagnosis not present

## 2022-03-17 DIAGNOSIS — G8929 Other chronic pain: Secondary | ICD-10-CM

## 2022-03-17 DIAGNOSIS — I10 Essential (primary) hypertension: Secondary | ICD-10-CM

## 2022-03-17 DIAGNOSIS — F5104 Psychophysiologic insomnia: Secondary | ICD-10-CM

## 2022-03-17 MED ORDER — QUETIAPINE FUMARATE 25 MG PO TABS: 25.0000 mg | ORAL_TABLET | Freq: Every day | ORAL | 1 refills | Status: DC

## 2022-03-17 MED ORDER — DICLOFENAC SODIUM 75 MG PO TBEC: 75.0000 mg | DELAYED_RELEASE_TABLET | Freq: Two times a day (BID) | ORAL | 5 refills | Status: DC

## 2022-03-17 MED ORDER — TELMISARTAN 40 MG PO TABS: 40.0000 mg | ORAL_TABLET | Freq: Every day | ORAL | 1 refills | Status: DC

## 2022-03-17 NOTE — Progress Notes (Signed)
Established Patient Office Visit  Subjective   Patient ID: Corinthian Kemler, female    DOB: Jan 25, 1976  Age: 46 y.o. MRN: [id number]  Chief Complaint  Patient presents with  . Follow-up    Patient declines pap smear today and requested to have this at the next visit     Pt is here for follow up on anxiety and insomnia  Anxiety - celexa 40 mg daily, we had increased it after her last visit. She reports she doesn't feel much improvement with this increase states she continues to have racing thoughts and she is still waking up at 2-3 AM and   Insomnia - patient on 100 mg of trazodone daily.    Patient Active Problem List   Diagnosis Date Noted  . Anxiety 02/13/2022  . Chronic insomnia 02/13/2022  . HTN (hypertension) 02/13/2022  . B12 deficiency 02/13/2022  . Iron deficiency anemia due to chronic blood loss 09/12/2018  . Menometrorrhagia 09/12/2018  . 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 Pernicious anemia 09/12/2018     Review of Systems  All other systems reviewed and are negative.     Objective:     BP (!) 118/90 (BP Location: Left Arm, Patient Position: Sitting, Cuff Size: Large)   Pulse 76   Temp 98.1 F (36.7 C) (Oral)   Ht 5' 7.5" (1.715 m)   Wt 194 lb 8 oz (88.2 kg)   LMP 01/31/2022 (Exact Date)   BMI 30.01 kg/m    Physical Exam Vitals reviewed.  Constitutional:      Appearance: Normal appearance. She is well-groomed and normal weight.  HENT:     Head: Normocephalic and atraumatic.  Eyes:     Extraocular Movements: Extraocular movements intact.     Pupils: Pupils are equal, round, and reactive to light.  Cardiovascular:     Heart sounds: S1 normal and S2 normal.  Pulmonary:     Breath sounds: Normal air entry.  Musculoskeletal:        General: Normal range of motion.     Right lower leg: No edema.     Left lower leg: No edema.  Skin:    General: Skin is warm and dry.  Neurological:     Mental Status: She is alert and oriented to  person, place, and time. Mental status is at baseline.     Gait: Gait is intact.  Psychiatric:        Mood and Affect: Mood and affect normal.        Speech: Speech normal.        Behavior: Behavior normal.        Judgment: Judgment normal.     No results found for any visits on 03/17/22.    The 10-year ASCVD risk score (Arnett DK, et al., 2019) is: 2.3%    Assessment & Plan:   Problem List Items Addressed This Visit       Cardiovascular and Mediastinum   HTN (hypertension) (Chronic)    I rechecked her BP in office and it was higher than when she came in, could be due to talking about her anxiety/ insomnia. She does not have a BP cuff at home and I advised she get one to check her BP regularly at home.  she will be returning to the office soon for a pap smear so we will recheck her BP at that time. If it remains elevated then we will discuss increasing her medication. For now will continue  Current hypertension medications:  Sig   telmisartan (MICARDIS) 40 MG tablet (Taking) Take 1 tablet (40 mg total) by mouth daily.         Other   Anxiety - Primary (Chronic)    Relevant Medications   Improved with the 20 mg of celexa however pt is still having breakthrough symptoms, will increase the dose to 40 mg today. Will see her back in 1 month to re-evaluate her symptoms.  citalopram (CELEXA) 40 MG tablet daily   Chronic insomnia (Chronic)   B12 deficiency (Chronic)    Patient needs a new level drawn, she continues to take daily supplements of B12 and iron.      Relevant Orders   Vitamin B12   Other Visit Diagnoses     Midline low back pain without sciatica, unspecified chronicity       Relevant Medications   diclofenac (VOLTAREN) 75 MG EC tablet, will add this for relief of her MSK back pain. Hopefully treatment of her back pain will reduce nighttime awakenings.    Screening for colon cancer       Relevant Orders   Cologuard       No follow-ups on file.     Karie Georges, MD

## 2022-03-17 NOTE — Patient Instructions (Addendum)
Decrease trazodone 50 mg at bedtime, start seroquel 25 mg daily at bedtime, may increase to 50 mg at bedtime after 1-2 weeks if the 25 mg is ineffective.  Decrease vitamin B12 to every other day.

## 2022-03-18 DIAGNOSIS — M545 Low back pain, unspecified: Secondary | ICD-10-CM | POA: Insufficient documentation

## 2022-03-18 NOTE — Assessment & Plan Note (Signed)
Dosage of celexa increased to 40 mg without improvement, still having significant sleep disturbance at night. I recommended referral to psychiatry and will try seroquel at bedtime to help with mood stabilization and sleep. RTC in 4 weeks.

## 2022-03-18 NOTE — Assessment & Plan Note (Signed)
BP remains elevated, however her anxiety is not controlled despite the increase in celexa, will continue current telmisartan 40 mg for now until her anxiety is under control.

## 2022-03-18 NOTE — Assessment & Plan Note (Signed)
Better on the diclofenac 75 mg BID, will continue this medication

## 2022-03-27 ENCOUNTER — Encounter: Payer: Self-pay | Admitting: Family Medicine

## 2022-03-27 ENCOUNTER — Ambulatory Visit (INDEPENDENT_AMBULATORY_CARE_PROVIDER_SITE_OTHER): Payer: PRIVATE HEALTH INSURANCE | Admitting: Family Medicine

## 2022-03-27 DIAGNOSIS — N951 Menopausal and female climacteric states: Secondary | ICD-10-CM

## 2022-03-27 MED ORDER — ESTRADIOL 1 MG PO TABS: 1.0000 mg | ORAL_TABLET | Freq: Every day | ORAL | 1 refills | Status: DC

## 2022-03-27 MED ORDER — PROGESTERONE MICRONIZED 100 MG PO CAPS: 100.0000 mg | ORAL_CAPSULE | Freq: Every day | ORAL | 1 refills | Status: DC

## 2022-03-27 NOTE — Progress Notes (Signed)
   Established Patient Office Visit  Subjective   Patient ID: Paula Sherman, female    DOB: 26-Sep-1975  Age: 46 y.o. MRN: [id number]  Chief Complaint  Patient presents with   Patient complains of severe hot flashes x1 month    Pt is reporting daily hot flashes, she reports that she stopped having periods in June 2023, states that she is having them several times a day, she is sweating so much that her hair is drenched. We had a long discussion about the risks/benefits of hormone replacement therapy and she is agreeable to trying it.   Anxiety-- pt reports significant improvement in her sleeping and her anxiety since starting the seroquel. Denies side effects, is happy with the 25 mg dose.      Review of Systems  All other systems reviewed and are negative.     Objective:     BP 138/80 (BP Location: Left Arm, Patient Position: Sitting, Cuff Size: Large)   Pulse 84   Temp 97.8 F (36.6 C) (Oral)   Ht 5' 7.5" (1.715 m)   Wt 194 lb 3.2 oz (88.1 kg)   LMP 01/31/2022 (Exact Date)   BMI 29.97 kg/m    Physical Exam Vitals reviewed.  Constitutional:      Appearance: Normal appearance. She is well-groomed and normal weight.  Eyes:     Extraocular Movements: Extraocular movements intact.     Conjunctiva/sclera: Conjunctivae normal.  Pulmonary:     Effort: Pulmonary effort is normal.  Skin:    General: Skin is warm and dry.  Neurological:     Mental Status: She is alert and oriented to person, place, and time. Mental status is at baseline.     Gait: Gait is intact.  Psychiatric:        Mood and Affect: Mood and affect normal.        Speech: Speech normal.        Behavior: Behavior normal.        Thought Content: Thought content normal.        Judgment: Judgment normal.     Comments: Patient appears much more calm today, less fidgeting, is smiling and happy      No results found for any visits on 03/27/22.    The 10-year ASCVD risk score (Arnett DK,  et al., 2019) is: 4.5%    Assessment & Plan:   Problem List Items Addressed This Visit       Cardiovascular and Mediastinum   Menopausal flushing    Severe, periods stopped 3 months ago, she is not a smoker. We discussed HRT and she is agreeable to try it. Pt already on SSRI which is not helping with her hot flashes. Will start estradiol 1 mg daily and 100 mg daily of progesterone. She already has a follow up appt with me 04/16/22. Will revisit her symptoms then.      Relevant Medications   estradiol (ESTRACE) 1 MG tablet   progesterone (PROMETRIUM) 100 MG capsule    No follow-ups on file.    Karie Georges, MD

## 2022-03-27 NOTE — Assessment & Plan Note (Signed)
Severe, periods stopped 3 months ago, she is not a smoker. We discussed HRT and she is agreeable to try it. Pt already on SSRI which is not helping with her hot flashes. Will start estradiol 1 mg daily and 100 mg daily of progesterone. She already has a follow up appt with me 04/16/22. Will revisit her symptoms then.

## 2022-04-16 ENCOUNTER — Ambulatory Visit: Payer: PRIVATE HEALTH INSURANCE | Admitting: Family Medicine

## 2022-04-30 ENCOUNTER — Telehealth (HOSPITAL_BASED_OUTPATIENT_CLINIC_OR_DEPARTMENT_OTHER): Payer: Self-pay | Admitting: Student in an Organized Health Care Education/Training Program

## 2022-04-30 ENCOUNTER — Encounter (HOSPITAL_COMMUNITY): Payer: Self-pay | Admitting: Student in an Organized Health Care Education/Training Program

## 2022-04-30 DIAGNOSIS — F411 Generalized anxiety disorder: Secondary | ICD-10-CM

## 2022-04-30 DIAGNOSIS — F419 Anxiety disorder, unspecified: Secondary | ICD-10-CM

## 2022-04-30 DIAGNOSIS — F5104 Psychophysiologic insomnia: Secondary | ICD-10-CM

## 2022-04-30 MED ORDER — QUETIAPINE FUMARATE 25 MG PO TABS: 25.0000 mg | ORAL_TABLET | Freq: Every day | ORAL | 1 refills | Status: DC

## 2022-04-30 MED ORDER — TRAZODONE HCL 100 MG PO TABS: 100.0000 mg | ORAL_TABLET | Freq: Every day | ORAL | 1 refills | Status: DC

## 2022-04-30 MED ORDER — CITALOPRAM HYDROBROMIDE 20 MG PO TABS: 20.0000 mg | ORAL_TABLET | Freq: Every day | ORAL | 1 refills | Status: DC
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
## 2022-05-04 ENCOUNTER — Ambulatory Visit (INDEPENDENT_AMBULATORY_CARE_PROVIDER_SITE_OTHER): Payer: BC Managed Care – PPO | Admitting: Family Medicine

## 2022-05-04 ENCOUNTER — Encounter: Payer: Self-pay | Admitting: Family Medicine

## 2022-05-04 VITALS — BP 152/90 | HR 82 | Temp 98.3°F | Ht 67.5 in | Wt 195.5 lb

## 2022-05-04 DIAGNOSIS — D500 Iron deficiency anemia secondary to blood loss (chronic): Secondary | ICD-10-CM | POA: Diagnosis not present

## 2022-05-04 DIAGNOSIS — N951 Menopausal and female climacteric states: Secondary | ICD-10-CM | POA: Diagnosis not present

## 2022-05-04 DIAGNOSIS — F411 Generalized anxiety disorder: Secondary | ICD-10-CM | POA: Diagnosis not present

## 2022-05-04 DIAGNOSIS — I10 Essential (primary) hypertension: Secondary | ICD-10-CM

## 2022-05-04 LAB — CBC WITH DIFFERENTIAL/PLATELET
Basophils Absolute: 0 10*3/uL (ref 0.0–0.1)
Basophils Relative: 0.2 % (ref 0.0–3.0)
Eosinophils Absolute: 0.1 10*3/uL (ref 0.0–0.7)
Eosinophils Relative: 1.9 % (ref 0.0–5.0)
HCT: 36.5 % (ref 36.0–46.0)
Hemoglobin: 12.1 g/dL (ref 12.0–15.0)
Lymphocytes Relative: 33.9 % (ref 12.0–46.0)
Lymphs Abs: 1.3 10*3/uL (ref 0.7–4.0)
MCHC: 33.1 g/dL (ref 30.0–36.0)
MCV: 88.5 fl (ref 78.0–100.0)
Monocytes Absolute: 0.4 10*3/uL (ref 0.1–1.0)
Monocytes Relative: 8.9 % (ref 3.0–12.0)
Neutro Abs: 2.2 10*3/uL (ref 1.4–7.7)
Neutrophils Relative %: 55.1 % (ref 43.0–77.0)
Platelets: 328 10*3/uL (ref 150.0–400.0)
RBC: 4.12 Mil/uL (ref 3.87–5.11)
RDW: 13 % (ref 11.5–15.5)
WBC: 4 10*3/uL (ref 4.0–10.5)

## 2022-05-04 MED ORDER — AMLODIPINE BESYLATE 5 MG PO TABS: 5.0000 mg | ORAL_TABLET | Freq: Every day | ORAL | 1 refills | Status: DC

## 2022-05-04 MED ORDER — QUETIAPINE FUMARATE 25 MG PO TABS: 75.0000 mg | ORAL_TABLET | Freq: Every day | ORAL | 5 refills | Status: DC

## 2022-05-04 NOTE — Assessment & Plan Note (Signed)
I reviewed the note from the psychiatrist, there are some features of possible bipolar disorder, will increase her quietapine to 75 mg at bedtime. I advised that she should continue the 20 mg dose of citalopram per the psychaitrists recommendations, she will be following up with the specialist in 1 month

## 2022-05-04 NOTE — Patient Instructions (Addendum)
ADD amlodipine 5 mg daily at bedtime   Continue your telmisartan 40 mg daily  Increase your quietapine to 75 mg (3 tablets) at bedtime  Try Zyrtec or claritin over the counter for mucus production/ allergies

## 2022-05-04 NOTE — Assessment & Plan Note (Signed)
Uncontrolled with 40 mg telmisartan, will add amlodipine 5 mg daily and she will follow up with me in 3 months

## 2022-05-04 NOTE — Assessment & Plan Note (Signed)
Recheck CBC. 

## 2022-05-04 NOTE — Progress Notes (Signed)
Established Patient Office Visit  Subjective   Patient ID: Paula Sherman, female    DOB: 1975/12/03  Age: 46 y.o. MRN: [id number]  Chief Complaint  Patient presents with   Medication Problem    Patient states her menstrual cycle started again in September, with severe heavy cycles, feels hot flashes are worse and also states psychiatrist mentioned concern due to elevated WBC   Anxiety    Patient states she feels her anxiety is worse, psychiatrist advised to decrease Lexapro     Patient is here for follow up. States that her citalopram was decreased to 20 mg and she was "all over the place", states that she was excessively cleaning things that didn't need to be cleaned. States that she is up to 50 mg at night of the seroquel and she is sleeping much better. We discussed increasing the seroquel to 75 mg and she is agreeable.   HTN-- BP not well controlled today, she is maxxed out currently on her telmisartan at 40 mg daily. We discussed adding amlodipine and she is agreeable to this. She denies any headaches, dizziness or chest pain.  Menopausal flushing-- pt reports the hormones we started caused her cycle to start again. She states that the HRT was not effective for reducing her hot flashes. We discussed this and I recommended she be referred to the GYN to discuss.    Current Outpatient Medications  Medication Instructions   ADVAIR HFA 115-21 MCG/ACT inhaler 2 puffs, Inhalation, 2 times daily   albuterol (VENTOLIN HFA) 108 (90 Base) MCG/ACT inhaler 2 puffs, Inhalation, Every 6 hours PRN   amLODipine (NORVASC) 5 mg, Oral, Daily at bedtime   citalopram (CELEXA) 20 mg, Oral, Daily   diclofenac (VOLTAREN) 75 mg, Oral, 2 times daily   estradiol (ESTRACE) 1 mg, Oral, Daily   progesterone (PROMETRIUM) 100 mg, Oral, Daily   QUEtiapine (SEROQUEL) 75 mg, Oral, Daily at bedtime   Spacer/Aero-Holding Chambers DEVI 1 Device, Does not apply, Daily PRN   telmisartan (MICARDIS) 40 mg,  Oral, Daily   traZODone (DESYREL) 100 mg, Oral, Daily at bedtime    Patient Active Problem List   Diagnosis Date Noted   GAD (generalized anxiety disorder) 04/30/2022   Menopausal flushing 03/27/2022   Midline low back pain without sciatica 03/18/2022   Anxiety 02/13/2022   Chronic insomnia 02/13/2022   HTN (hypertension) 02/13/2022   B12 deficiency 02/13/2022   Iron deficiency anemia due to chronic blood loss 09/12/2018   Menometrorrhagia 09/12/2018   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     Objective:     BP (!) 152/90 (BP Location: Right Arm, Cuff Size: Large)   Pulse 82   Temp 98.3 F (36.8 C) (Oral)   Ht 5' 7.5" (1.715 m)   Wt 195 lb 8 oz (88.7 kg)   LMP 05/01/2022 (Exact Date)   BMI 30.17 kg/m    Physical Exam Vitals reviewed.  Constitutional:      Appearance: Normal appearance. She is well-groomed and normal weight.  Eyes:     Conjunctiva/sclera: Conjunctivae normal.  Neck:     Thyroid: No thyromegaly.  Cardiovascular:     Rate and Rhythm: Normal rate and regular rhythm.     Pulses: Normal pulses.     Heart sounds: S1 normal and S2 normal.  Pulmonary:     Effort: Pulmonary effort is normal.  Breath sounds: Normal breath sounds and air entry.  Abdominal:     General: Bowel sounds are normal.  Neurological:     Mental Status: She is alert and oriented to person, place, and time. Mental status is at baseline.     Gait: Gait is intact.  Psychiatric:        Mood and Affect: Mood and affect normal.        Speech: Speech normal.        Behavior: Behavior normal.        Judgment: Judgment normal.       The 10-year ASCVD risk score (Arnett DK, et al., 2019) is: 6.9%    Assessment & Plan:   Problem List Items Addressed This Visit       Cardiovascular and Mediastinum   Menopausal flushing    Not improved with HRT, will send to GYN for further  recommendations, referral placed      Relevant Medications   amLODipine (NORVASC) 5 MG tablet   Other Relevant Orders   Ambulatory referral to Gynecology   HTN (hypertension) (Chronic)    Uncontrolled with 40 mg telmisartan, will add amlodipine 5 mg daily and she will follow up with me in 3 months      Relevant Medications   amLODipine (NORVASC) 5 MG tablet     Other   GAD (generalized anxiety disorder)    I reviewed the note from the psychiatrist, there are some features of possible bipolar disorder, will increase her quietapine to 75 mg at bedtime. I advised that she should continue the 20 mg dose of citalopram per the psychaitrists recommendations, she will be following up with the specialist in 1 month      Relevant Medications   QUEtiapine (SEROQUEL) 25 MG tablet   Iron deficiency anemia due to chronic blood loss - Primary (Chronic)    Recheck CBC      Relevant Orders   CBC with Differential/Platelets (Completed)   Ambulatory referral to Hematology / Oncology    Return in about 3 months (around 08/04/2022) for follow up on HTN.    Karie Georges, MD

## 2022-05-04 NOTE — Assessment & Plan Note (Signed)
Not improved with HRT, will send to GYN for further recommendations, referral placed

## 2022-05-12 ENCOUNTER — Encounter: Payer: Self-pay | Admitting: Hematology & Oncology

## 2022-05-19 ENCOUNTER — Encounter: Payer: Self-pay | Admitting: Hematology & Oncology

## 2022-05-19 ENCOUNTER — Ambulatory Visit: Payer: PRIVATE HEALTH INSURANCE | Admitting: Radiology

## 2022-05-19 ENCOUNTER — Encounter: Payer: Self-pay | Admitting: Radiology

## 2022-05-19 VITALS — BP 136/84 | Ht 65.5 in | Wt 195.0 lb

## 2022-05-19 DIAGNOSIS — N951 Menopausal and female climacteric states: Secondary | ICD-10-CM

## 2022-05-19 MED ORDER — ESTRADIOL 0.05 MG/24HR TD PTTW: 1.0000 | MEDICATED_PATCH | TRANSDERMAL | 0 refills | Status: DC

## 2022-05-19 MED ORDER — PROGESTERONE MICRONIZED 100 MG PO CAPS: 200.0000 mg | ORAL_CAPSULE | Freq: Every day | ORAL | 0 refills | Status: DC

## 2022-05-19 NOTE — Progress Notes (Signed)
   Alvis Pulcini Hall[place]Sledge June 14, 1976 [id number]   History:  46 y.o. G2P2 presents for management of vasomotor symptoms and HRT. Periods stopped 6/23 and patient began experiencing hot flashes and night sweats. She was started on oral estrogen and progesterone by PCP and patient states hot flashes have worsened along with her moods. HX of HTN, BP has increased since starting estrogen. Her period has also returned and is very heavy. Hx of anemia.   Gynecologic History Patient's last menstrual period was 05/01/2022 (exact date). Period Pattern: (!) Irregular Contraception/Family planning: abstinence Sexually active: no Last mammogram: 11/14/21. Results were: normal  Obstetric History OB History  Gravida Para Term Preterm AB Living  2 2       2   SAB IAB Ectopic Multiple Live Births               # Outcome Date GA Lbr Len/2nd Weight Sex Delivery Anes PTL Lv  2 Para           1 Para              The following portions of the patient's history were reviewed and updated as appropriate: allergies, current medications, past family history, past medical history, past social history, past surgical history, and problem list.  Review of Systems Pertinent items noted in HPI and remainder of comprehensive ROS otherwise negative.   Past medical history, past surgical history, family history and social history were all reviewed and documented in the EPIC chart.   Exam:  Vitals:   05/19/22 0858  BP: 136/84  Weight: 195 lb (88.5 kg)  Height: 5' 5.5" (1.664 m)   Body mass index is 31.96 kg/m.  Physical Exam Constitutional:      Appearance: She is normal weight.  HENT:     Head: Normocephalic and atraumatic.  Cardiovascular:     Rate and Rhythm: Normal rate and regular rhythm.     Pulses: Normal pulses.     Heart sounds: Normal heart sounds.  Pulmonary:     Effort: Pulmonary effort is normal.     Breath sounds: Normal breath sounds.  Abdominal:     General: Abdomen is flat. Bowel  sounds are normal.     Palpations: Abdomen is soft.  Neurological:     Mental Status: She is alert and oriented to person, place, and time.  Psychiatric:        Mood and Affect: Mood normal.        Thought Content: Thought content normal.        Judgment: Judgment normal.      Assessment/Plan:   1. Vasomotor symptoms due to menopause  Will switch to transdermal estrogen as oral will increase her cardiovascular risk. Due to heavy bleeding will increase progesterone to 200mg  nightly to avoid menorrhagia.  - estradiol (VIVELLE-DOT) 0.05 MG/24HR patch; Place 1 patch (0.05 mg total) onto the skin 2 (two) times a week.  Dispense: 24 patch; Refill: 0 - progesterone (PROMETRIUM) 100 MG capsule; Take 2 capsules (200 mg total) by mouth daily.  Dispense: 180 capsule; Refill: 0  Discussed the risks of estrogen use with HTN.   Monitor BP, if it remains elevated may need to switch to [facility] I also think increasing her celexa to 40mg  may help with her vasomotor symptoms and mood changes. Has appt with PCP scheduled next month per pt.   Follow up 3 months for AEX and pap along with HRT f/u.  Rubbie Battiest B WHNP-BC 9:44 AM 05/19/2022

## 2022-05-25 ENCOUNTER — Inpatient Hospital Stay (HOSPITAL_BASED_OUTPATIENT_CLINIC_OR_DEPARTMENT_OTHER): Payer: BC Managed Care – PPO | Admitting: Hematology & Oncology

## 2022-05-25 ENCOUNTER — Encounter: Payer: Self-pay | Admitting: Hematology & Oncology

## 2022-05-25 ENCOUNTER — Inpatient Hospital Stay: Payer: PRIVATE HEALTH INSURANCE | Attending: Hematology & Oncology

## 2022-05-25 VITALS — BP 183/99 | HR 70 | Temp 97.5°F | Resp 20 | Ht 66.0 in | Wt 199.0 lb

## 2022-05-25 DIAGNOSIS — Z803 Family history of malignant neoplasm of breast: Secondary | ICD-10-CM | POA: Diagnosis not present

## 2022-05-25 DIAGNOSIS — Z862 Personal history of diseases of the blood and blood-forming organs and certain disorders involving the immune mechanism: Secondary | ICD-10-CM

## 2022-05-25 DIAGNOSIS — D72819 Decreased white blood cell count, unspecified: Secondary | ICD-10-CM | POA: Insufficient documentation

## 2022-05-25 DIAGNOSIS — Z8049 Family history of malignant neoplasm of other genital organs: Secondary | ICD-10-CM

## 2022-05-25 DIAGNOSIS — Z808 Family history of malignant neoplasm of other organs or systems: Secondary | ICD-10-CM | POA: Insufficient documentation

## 2022-05-25 DIAGNOSIS — D500 Iron deficiency anemia secondary to blood loss (chronic): Secondary | ICD-10-CM

## 2022-05-25 LAB — SAVE SMEAR(SSMR), FOR PROVIDER SLIDE REVIEW

## 2022-05-25 LAB — CBC WITH DIFFERENTIAL (CANCER CENTER ONLY)
Abs Immature Granulocytes: 0 10*3/uL (ref 0.00–0.07)
Basophils Absolute: 0 10*3/uL (ref 0.0–0.1)
Basophils Relative: 1 %
Eosinophils Absolute: 0.1 10*3/uL (ref 0.0–0.5)
Eosinophils Relative: 2 %
HCT: 36.3 % (ref 36.0–46.0)
Hemoglobin: 12 g/dL (ref 12.0–15.0)
Immature Granulocytes: 0 %
Lymphocytes Relative: 42 %
Lymphs Abs: 1.3 10*3/uL (ref 0.7–4.0)
MCH: 29.8 pg (ref 26.0–34.0)
MCHC: 33.1 g/dL (ref 30.0–36.0)
MCV: 90.1 fL (ref 80.0–100.0)
Monocytes Absolute: 0.4 10*3/uL (ref 0.1–1.0)
Monocytes Relative: 12 %
Neutro Abs: 1.3 10*3/uL — ABNORMAL LOW (ref 1.7–7.7)
Neutrophils Relative %: 43 %
Platelet Count: 259 10*3/uL (ref 150–400)
RBC: 4.03 MIL/uL (ref 3.87–5.11)
RDW: 13 % (ref 11.5–15.5)
WBC Count: 3.1 10*3/uL — ABNORMAL LOW (ref 4.0–10.5)
nRBC: 0 % (ref 0.0–0.2)

## 2022-05-25 LAB — LACTATE DEHYDROGENASE: LDH: 189 U/L (ref 98–192)

## 2022-05-25 LAB — RETICULOCYTES
Immature Retic Fract: 11.1 % (ref 2.3–15.9)
RBC.: 4 MIL/uL (ref 3.87–5.11)
Retic Count, Absolute: 72 10*3/uL (ref 19.0–186.0)
Retic Ct Pct: 1.8 % (ref 0.4–3.1)

## 2022-05-25 LAB — FERRITIN: Ferritin: 52 ng/mL (ref 11–307)
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
## 2022-05-26 LAB — ERYTHROPOIETIN: Erythropoietin: 16.1 m[IU]/mL (ref 2.6–18.5)

## 2022-05-26 LAB — IRON AND IRON BINDING CAPACITY (CC-WL,HP ONLY)
Iron: 53 ug/dL (ref 28–170)
Saturation Ratios: 18 % (ref 10.4–31.8)
TIBC: 293 ug/dL (ref 250–450)
UIBC: 240 ug/dL (ref 148–442)

## 2022-05-27 LAB — COLD AGGLUTININ TITER: Cold Agglutinin Titer: NEGATIVE

## 2022-05-27 LAB — HGB FRACTIONATION CASCADE
Hgb A2: 2.6 % (ref 1.8–3.2)
Hgb A: 97.4 % (ref 96.4–98.8)
Hgb F: 0 % (ref 0.0–2.0)
Hgb S: 0 %

## 2022-06-10 ENCOUNTER — Encounter: Payer: Self-pay | Admitting: Hematology & Oncology

## 2022-06-19 ENCOUNTER — Encounter: Payer: Self-pay | Admitting: Family Medicine

## 2022-06-19 ENCOUNTER — Ambulatory Visit (INDEPENDENT_AMBULATORY_CARE_PROVIDER_SITE_OTHER): Payer: BC Managed Care – PPO | Admitting: Family Medicine

## 2022-06-19 VITALS — BP 156/90 | HR 90 | Temp 98.4°F | Ht 66.0 in | Wt 200.0 lb

## 2022-06-19 DIAGNOSIS — J040 Acute laryngitis: Secondary | ICD-10-CM | POA: Diagnosis not present

## 2022-06-19 DIAGNOSIS — R059 Cough, unspecified: Secondary | ICD-10-CM | POA: Diagnosis not present

## 2022-06-19 MED ORDER — HYDROCODONE BIT-HOMATROP MBR 5-1.5 MG/5ML PO SOLN: 5.0000 mL | Freq: Three times a day (TID) | ORAL | 0 refills | Status: DC | PRN
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
## 2022-06-22 ENCOUNTER — Telehealth: Payer: Self-pay | Admitting: Family Medicine

## 2022-06-22 MED ORDER — HYDROCODONE BIT-HOMATROP MBR 5-1.5 MG/5ML PO SOLN: 5.0000 mL | Freq: Four times a day (QID) | ORAL | 0 refills | Status: DC | PRN

## 2022-06-22 MED ORDER — HYDROCODONE BIT-HOMATROP MBR 5-1.5 MG/5ML PO SOLN: 5.0000 mL | Freq: Three times a day (TID) | ORAL | 0 refills | Status: DC | PRN

## 2022-06-22 NOTE — Telephone Encounter (Signed)
Spoke with Shawna Orleans, pharmacist and she stated a prior Berkley Harvey is needed as the insurance will not cover the 8-day supply, will only cover a 7-day supply.  She stated the medication is out of stock at their pharmacy and the Rx would need to be sent to a different pharmacy?  Patient was informed of this and requested the Rx be re-sent to [facility] [address]  Message sent to Dr Caryl Never.

## 2022-06-22 NOTE — Telephone Encounter (Signed)
Patient informed of the message below.

## 2022-06-22 NOTE — Telephone Encounter (Signed)
I have resent the Hycodan cough syrup to the requested [facility] on [address]  Kristian Covey MD [facility]

## 2022-06-22 NOTE — Addendum Note (Signed)
Addended by: Kristian Covey on: 06/22/2022 12:56 PM   Modules accepted: Orders

## 2022-06-22 NOTE — Telephone Encounter (Signed)
Pt needs HYDROcodone bit-homatropine (HYCODAN) 5-1.5 MG/5ML syrup to be sent to  [id number] - [place], [place] - [address] AT [facility] [address] [facility] [address] Phone: [phone]  Fax: [phone]    Needs to be written for 7 days so that insurance will pay for it.

## 2022-06-22 NOTE — Telephone Encounter (Signed)
Pt was just seen by Dr. Caryl Never on Friday - 06/19/22 and says the pharmacy is still waiting on MD to ok her prescription.  [id number] - [place] ([place]), [place] - [address] Phone: [phone]  Fax: [phone]     Pt states she has not slept since Friday and is asking that her medication be sent ASAP.  Please advise.

## 2022-06-23 ENCOUNTER — Encounter: Payer: Self-pay | Admitting: Hematology & Oncology

## 2022-06-29 ENCOUNTER — Other Ambulatory Visit (HOSPITAL_COMMUNITY): Payer: Self-pay

## 2022-06-29 ENCOUNTER — Encounter: Payer: Self-pay | Admitting: Hematology & Oncology

## 2022-07-07 ENCOUNTER — Telehealth: Payer: Self-pay | Admitting: Family Medicine

## 2022-07-07 DIAGNOSIS — R059 Cough, unspecified: Secondary | ICD-10-CM

## 2022-07-07 MED ORDER — PROMETHAZINE-DM 6.25-15 MG/5ML PO SYRP: 5.0000 mL | ORAL_SOLUTION | Freq: Four times a day (QID) | ORAL | 0 refills | Status: DC | PRN

## 2022-07-07 NOTE — Telephone Encounter (Signed)
Spoke with the patient and informed her of the message below.  Patient complains of a recurrent cough and an appt was scheduled for 12/27.

## 2022-07-07 NOTE — Telephone Encounter (Signed)
Pt called stating the HYDROcodone bit-homatropine (HYCODAN) 5-1.5 MG/5ML syrup didn't offer much relief. Cough still remains and coughing up lots of mucus. Asking if she needs another prescription OTC not helping

## 2022-07-08 ENCOUNTER — Encounter: Payer: Self-pay | Admitting: Family Medicine

## 2022-07-08 ENCOUNTER — Ambulatory Visit (INDEPENDENT_AMBULATORY_CARE_PROVIDER_SITE_OTHER): Payer: BC Managed Care – PPO | Admitting: Family Medicine

## 2022-07-08 VITALS — BP 138/84 | HR 82 | Temp 97.6°F | Ht 66.0 in | Wt 201.0 lb

## 2022-07-08 DIAGNOSIS — R059 Cough, unspecified: Secondary | ICD-10-CM

## 2022-07-08 DIAGNOSIS — J0100 Acute maxillary sinusitis, unspecified: Secondary | ICD-10-CM | POA: Diagnosis not present

## 2022-07-08 MED ORDER — DOXYCYCLINE HYCLATE 100 MG PO TABS: 100.0000 mg | ORAL_TABLET | Freq: Two times a day (BID) | ORAL | 0 refills | Status: AC

## 2022-07-08 MED ORDER — METHYLPREDNISOLONE 4 MG PO TBPK: ORAL_TABLET | ORAL | 0 refills | Status: DC

## 2022-07-08 MED ORDER — ALBUTEROL SULFATE HFA 108 (90 BASE) MCG/ACT IN AERS: 2.0000 | INHALATION_SPRAY | Freq: Four times a day (QID) | RESPIRATORY_TRACT | 0 refills | Status: DC | PRN

## 2022-07-08 NOTE — Progress Notes (Signed)
Established Patient Office Visit  Subjective   Patient ID: Paula Sherman, female    DOB: [birth year]  Age: 46 y.o. MRN: [id number]  Chief Complaint  Patient presents with   Cough    Productive with brown-yellow sputum x4-5 days, tried OTC cold medication with relief   Nasal Congestion    X4-5 days    Patient is reporting persistent symptoms since she was last seen. States that she was seen on 12/6 and her symptoms have persisted since that time. She has been using the cough medication and also OTC medications to reduce the congestion however she has not had improvement. She reports no new fever or chills, denies ear pain or sore throat. Reports that seh cannot breathe out of her nose, totall clogged and there is some pressure in her face on both sides. No pain.   Cough This is a new problem. The current episode started 1 to 4 weeks ago. The problem has been gradually worsening. The cough is Productive of purulent sputum. Associated symptoms include shortness of breath. Pertinent negatives include no chest pain. She has tried prescription cough suppressant for the symptoms. There is no history of asthma or emphysema.   Current Outpatient Medications  Medication Instructions   albuterol (VENTOLIN HFA) 108 (90 Base) MCG/ACT inhaler 2 puffs, Inhalation, Every 6 hours PRN   amLODipine (NORVASC) 5 mg, Oral, Daily at bedtime   Ascorbic Acid (VITAMIN C PO) Oral, Daily   citalopram (CELEXA) 20 mg, Oral, Daily   Cyanocobalamin (B-12 PO) Oral, Daily   diclofenac (VOLTAREN) 75 mg, Oral, 2 times daily   doxycycline (VIBRA-TABS) 100 mg, Oral, 2 times daily   estradiol (VIVELLE-DOT) 0.05 mg, Transdermal, 2 times weekly   Ferrous Sulfate (IRON PO) Oral, Daily   methylPREDNISolone (MEDROL DOSEPAK) 4 MG TBPK tablet Take package as directed   progesterone (PROMETRIUM) 200 mg, Oral, Daily   promethazine-dextromethorphan (PROMETHAZINE-DM) 6.25-15 MG/5ML syrup 5 mLs, Oral, 4 times daily PRN    QUEtiapine (SEROQUEL) 75 mg, Oral, Daily at bedtime   Spacer/Aero-Holding Chambers DEVI 1 Device, Does not apply, Daily PRN   telmisartan (MICARDIS) 40 mg, Oral, Daily   traZODone (DESYREL) 100 mg, Oral, Daily at bedtime    Patient Active Problem List   Diagnosis Date Noted   GAD (generalized anxiety disorder) 04/30/2022   Menopausal flushing 03/27/2022   Midline low back pain without sciatica 03/18/2022   Anxiety 02/13/2022   Chronic insomnia 02/13/2022   HTN (hypertension) 02/13/2022   B12 deficiency 02/13/2022   Iron deficiency anemia due to chronic blood loss 09/12/2018   Menometrorrhagia 09/12/2018   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   Cardiovascular:  Negative for chest pain.      Objective:     BP 138/84 (BP Location: Left Arm, Patient Position: Sitting, Cuff Size: Large)   Pulse 82   Temp 97.6 F (36.4 C) (Oral)   Ht 5\' 6"  (1.676 m)   Wt 201 lb (91.2 kg)   SpO2 100%   BMI 32.44 kg/m    Physical Exam Vitals reviewed.  Constitutional:      Appearance: Normal appearance. She is well-groomed and normal weight.  HENT:     Right Ear: Tympanic membrane normal.     Left Ear: Tympanic membrane normal.     Nose: Congestion and rhinorrhea present.     Mouth/Throat:     Mouth:  Mucous membranes are moist.     Pharynx: No oropharyngeal exudate or posterior oropharyngeal erythema.  Eyes:     Conjunctiva/sclera: Conjunctivae normal.  Cardiovascular:     Rate and Rhythm: Normal rate and regular rhythm.     Pulses: Normal pulses.     Heart sounds: S1 normal and S2 normal.  Pulmonary:     Effort: Pulmonary effort is normal.     Breath sounds: Normal breath sounds and air entry. No wheezing or rhonchi.  Musculoskeletal:     Right lower leg: No edema.     Left lower leg: No edema.  Lymphadenopathy:     Cervical: No cervical adenopathy.  Neurological:      Mental Status: She is alert and oriented to person, place, and time. Mental status is at baseline.     Gait: Gait is intact.  Psychiatric:        Mood and Affect: Mood and affect normal.        Speech: Speech normal.        Behavior: Behavior normal.        Judgment: Judgment normal.      No results found for any visits on 07/08/22.    The 10-year ASCVD risk score (Arnett DK, et al., 2019) is: 4.8%    Assessment & Plan:   Problem List Items Addressed This Visit   None Visit Diagnoses     Acute non-recurrent maxillary sinusitis    -  Primary   Relevant Medications   >2 weeks of progressive symptoms, lungs are clear on exam. I will treat with doxycycline 100 mg BID for 7 days (PCN allergy) and use a medrol dose pak to help reduce inflammation and swelling in the sinuses. doxycycline (VIBRA-TABS) 100 MG tablet   methylPREDNISolone (MEDROL DOSEPAK) 4 MG TBPK tablet       No follow-ups on file.    Karie Georges, MD

## 2022-07-20 ENCOUNTER — Telehealth: Payer: Self-pay | Admitting: Family Medicine

## 2022-07-20 DIAGNOSIS — R059 Cough, unspecified: Secondary | ICD-10-CM

## 2022-07-20 NOTE — Telephone Encounter (Signed)
I would get a chest x-ray on her -- I already treated her with antibiotics and steroids so she might need further work up to rule out pneumonia-- would she be agreeable to getting a chest xray?

## 2022-07-20 NOTE — Telephone Encounter (Signed)
Treated on 07/08/22 still experiencing congestion, sinus pressure and drainage, cough would like something to treat these lingering symptoms.

## 2022-07-20 NOTE — Telephone Encounter (Signed)
Patient informed of the message below.  Patient agreed to have a chest x-ray and a lab appt was scheduled for 1/9.  Message sent to PCP for orders.

## 2022-07-20 NOTE — Telephone Encounter (Signed)
I would have her try OTC zyrtec 10 mg daily, this will help with the sinus drainage and mucus

## 2022-07-20 NOTE — Telephone Encounter (Signed)
Patient informed of the message below.  Patient stated she has been taking Zyrtec daily with no relief.  Stated she still complains of a productive cough with yellow sputum x1 day, used inhaler due to severe coughing spell with no relief and  sinus pressure behind eyes noted since yesterday also.  Message sent to PCP.

## 2022-07-21 ENCOUNTER — Telehealth: Payer: Self-pay | Admitting: Family Medicine

## 2022-07-21 ENCOUNTER — Ambulatory Visit (INDEPENDENT_AMBULATORY_CARE_PROVIDER_SITE_OTHER): Payer: BC Managed Care – PPO

## 2022-07-21 ENCOUNTER — Other Ambulatory Visit: Payer: PRIVATE HEALTH INSURANCE

## 2022-07-21 DIAGNOSIS — R059 Cough, unspecified: Secondary | ICD-10-CM

## 2022-07-21 MED ORDER — DM-GUAIFENESIN ER 30-600 MG PO TB12: 1.0000 | ORAL_TABLET | Freq: Two times a day (BID) | ORAL | 0 refills | Status: DC

## 2022-07-21 NOTE — Addendum Note (Signed)
Addended by: Farrel Conners on: 07/21/2022 09:43 AM   Modules accepted: Orders

## 2022-07-21 NOTE — Telephone Encounter (Signed)
Patient informed of the message below.

## 2022-07-21 NOTE — Telephone Encounter (Signed)
Pt is calling and said she viewed her xray results on mychart and still has cough and head congestion and would like something sent to [place] ([place]),  - [address] Phone: [phone]  Fax: [phone]

## 2022-07-21 NOTE — Progress Notes (Signed)
Her chest film is normal, I would recommend she continue to use OTC decongestants and antihistamines to reduce the lingering symptoms. We have had multiple patients who have had symptoms that linger for a longer period of time and it just may take more time to get better.

## 2022-07-22 ENCOUNTER — Telehealth: Payer: Self-pay | Admitting: Family Medicine

## 2022-07-22 DIAGNOSIS — J0100 Acute maxillary sinusitis, unspecified: Secondary | ICD-10-CM

## 2022-07-22 MED ORDER — PSEUDOEPH-BROMPHEN-DM 30-2-10 MG/5ML PO SYRP: 5.0000 mL | ORAL_SOLUTION | Freq: Four times a day (QID) | ORAL | 0 refills | Status: DC | PRN

## 2022-07-22 MED ORDER — FLUTICASONE PROPIONATE 50 MCG/ACT NA SUSP: 2.0000 | Freq: Every day | NASAL | 0 refills | Status: AC

## 2022-07-22 NOTE — Telephone Encounter (Signed)
Rx sent 

## 2022-07-22 NOTE — Telephone Encounter (Signed)
Patient informed of the message below.

## 2022-07-22 NOTE — Telephone Encounter (Signed)
Pt states the Mucinex is not working. Pt is asking that MD please send something stronger to:  [place] ([place]), [place] - [address] Phone: [phone]  Fax: [phone]

## 2022-07-29 ENCOUNTER — Other Ambulatory Visit: Payer: Self-pay | Admitting: Family Medicine

## 2022-07-29 DIAGNOSIS — R059 Cough, unspecified: Secondary | ICD-10-CM

## 2022-08-07 ENCOUNTER — Telehealth: Payer: Self-pay | Admitting: Family Medicine

## 2022-08-07 DIAGNOSIS — F411 Generalized anxiety disorder: Secondary | ICD-10-CM

## 2022-08-07 DIAGNOSIS — F5104 Psychophysiologic insomnia: Secondary | ICD-10-CM

## 2022-08-07 NOTE — Telephone Encounter (Signed)
I left a detailed message at the patient's cell number with the information below.  ?

## 2022-08-07 NOTE — Addendum Note (Signed)
Addended by: Farrel Conners on: 08/07/2022 02:32 PM   Modules accepted: Orders

## 2022-08-07 NOTE — Telephone Encounter (Signed)
Patient informed of the message below.  Patient stated she is not having problems at night, feels she needs help during the day, would prefer to increase Citalopram and requested the new Rx be sent to [facility].  Message sent to PCP.

## 2022-08-07 NOTE — Telephone Encounter (Signed)
She has a psychiatrist-- I would recommend she see him since he is the specialist..Marland Kitchen

## 2022-08-07 NOTE — Telephone Encounter (Signed)
Patient states the citalopram (CELEXA) 20 MG tablet is not helping, still feels like she's "all over the place" and anxious.

## 2022-08-07 NOTE — Telephone Encounter (Signed)
Patient called back and stated she no longer has a psychiatrist as the previous provider is no longer covered by insurance (she cannot recall the physicians name).  Message sent to PCP.

## 2022-08-10 MED ORDER — CITALOPRAM HYDROBROMIDE 40 MG PO TABS: 40.0000 mg | ORAL_TABLET | Freq: Every day | ORAL | 1 refills | Status: DC

## 2022-08-10 NOTE — Addendum Note (Signed)
Addended by: Farrel Conners on: 08/10/2022 09:14 AM   Modules accepted: Orders

## 2022-08-10 NOTE — Telephone Encounter (Signed)
40 mg dose sent

## 2022-08-19 ENCOUNTER — Ambulatory Visit: Payer: PRIVATE HEALTH INSURANCE | Admitting: Radiology

## 2022-08-20 ENCOUNTER — Ambulatory Visit: Payer: PRIVATE HEALTH INSURANCE | Admitting: Family Medicine

## 2022-08-22 ENCOUNTER — Other Ambulatory Visit: Payer: Self-pay | Admitting: Radiology

## 2022-08-22 DIAGNOSIS — N951 Menopausal and female climacteric states: Secondary | ICD-10-CM

## 2022-08-23 ENCOUNTER — Other Ambulatory Visit: Payer: Self-pay | Admitting: Family Medicine

## 2022-08-23 DIAGNOSIS — M545 Low back pain, unspecified: Secondary | ICD-10-CM

## 2022-08-24 NOTE — Telephone Encounter (Signed)
Last visit 05/19/2022--scheduled for 08/25/2022. Last mammo 11/14/2021-neg birads 1  Will refuse for now since pt has appt tomorrow. Will refill then if appropriate.

## 2022-08-25 ENCOUNTER — Ambulatory Visit: Payer: PRIVATE HEALTH INSURANCE | Admitting: Radiology

## 2022-09-04 ENCOUNTER — Ambulatory Visit (INDEPENDENT_AMBULATORY_CARE_PROVIDER_SITE_OTHER): Payer: BC Managed Care – PPO | Admitting: Family Medicine

## 2022-09-04 ENCOUNTER — Encounter: Payer: Self-pay | Admitting: Family Medicine

## 2022-09-04 VITALS — BP 164/110 | HR 91 | Temp 98.3°F | Ht 66.0 in | Wt 203.1 lb

## 2022-09-04 DIAGNOSIS — M7551 Bursitis of right shoulder: Secondary | ICD-10-CM | POA: Diagnosis not present

## 2022-09-04 MED ORDER — METHYLPREDNISOLONE 4 MG PO TBPK: ORAL_TABLET | ORAL | 0 refills | Status: DC

## 2022-09-04 NOTE — Progress Notes (Unsigned)
   Acute Office Visit  Subjective:     Patient ID: Paula Sherman, female    DOB: October 06, 1975, 47 y.o.   MRN: [id number]  Chief Complaint  Patient presents with   Shoulder Pain    Patient complains of right shoulder, radiating to the right lower arm and hand x2 weeks, no known injury and tried Tylenol with no relief    Shoulder Pain    Patient is in today for new onset of right shoulder, states it started about 2-3 weeks ago, no known injury to the shoulder. States that it hurts like a toothache, states she just woke up one night with the pain and it has been there ever since. No trauma, pt reports she has full range of motion, no increase in pain with movement. Feels like a throbbing pain. Has tried heat, ice, tylenol, ibuprofen without any improvement.   Review of Systems  All other systems reviewed and are negative.       Objective:    BP (!) 164/110 Comment: repeated by Mykal--jaf  Pulse 91   Temp 98.3 F (36.8 C) (Oral)   Ht 5' 6"$  (1.676 m)   Wt 203 lb 1.6 oz (92.1 kg)   LMP 08/11/2022 (Exact Date)   SpO2 98%   BMI 32.78 kg/m  {Vitals History (Optional):23777}  Physical Exam Vitals reviewed.     No results found for any visits on 09/04/22.      Assessment & Plan:   Problem List Items Addressed This Visit   None Visit Diagnoses     Subacromial bursitis of right shoulder joint    -  Primary   Relevant Medications   methylPREDNISolone (MEDROL DOSEPAK) 4 MG TBPK tablet       Meds ordered this encounter  Medications   methylPREDNISolone (MEDROL DOSEPAK) 4 MG TBPK tablet    Sig: Take package as directed.    Dispense:  21 each    Refill:  0    No follow-ups on file.  Farrel Conners, MD

## 2022-09-07 ENCOUNTER — Telehealth: Payer: Self-pay | Admitting: Family Medicine

## 2022-09-07 NOTE — Telephone Encounter (Signed)
Patient checking on referral to behavioral health. Says no one has reached out and the name of the provider is not clear in the referral. Please advise

## 2022-09-07 NOTE — Telephone Encounter (Signed)
Spoke with Loma Sousa at [facility] and informed her of the instructions as below.  She stated the patient will be sent a message when the Rx is ready.

## 2022-09-07 NOTE — Telephone Encounter (Signed)
6 tablets day 1, 5 tablets day 2, 4 tablets day 3, 3 tablets day 4, 2 tablets day 5, 1 tablet day 6

## 2022-09-07 NOTE — Telephone Encounter (Signed)
Patient states pharmacy cannot fill methylPREDNISolone (MEDROL DOSEPAK) 4 MG TBPK tablet , they need clarity on how to dispense

## 2022-09-08 ENCOUNTER — Telehealth: Payer: Self-pay | Admitting: Family Medicine

## 2022-09-08 NOTE — Telephone Encounter (Signed)
Pt states the pharmacy needs the doage on her steroid patch.  It was called in Friday and pt still can't pick up her prescription.  Please call pt back once it has been called to the pharmacy.

## 2022-09-08 NOTE — Telephone Encounter (Signed)
I called the patient and informed her I spoke with Paula Sherman at [facility] yesterday regarding the Rx per PCP.  Patient stated she will call the pharmacy and call back with any further problems.

## 2022-09-10 NOTE — Telephone Encounter (Signed)
Spoke with the patient and informed her of the message below.

## 2022-09-15 ENCOUNTER — Ambulatory Visit (INDEPENDENT_AMBULATORY_CARE_PROVIDER_SITE_OTHER): Payer: BC Managed Care – PPO | Admitting: Radiology

## 2022-09-15 ENCOUNTER — Encounter: Payer: Self-pay | Admitting: Radiology

## 2022-09-15 ENCOUNTER — Other Ambulatory Visit (HOSPITAL_COMMUNITY)
Admission: RE | Admit: 2022-09-15 | Discharge: 2022-09-15 | Disposition: A | Discharge status: Home / Self Care | Payer: BC Managed Care – PPO | Source: Ambulatory Visit | Attending: Radiology | Admitting: Radiology

## 2022-09-15 ENCOUNTER — Encounter: Payer: Self-pay | Admitting: Hematology & Oncology

## 2022-09-15 VITALS — BP 124/84 | Ht 65.5 in | Wt 202.0 lb

## 2022-09-15 DIAGNOSIS — Z01419 Encounter for gynecological examination (general) (routine) without abnormal findings: Secondary | ICD-10-CM | POA: Insufficient documentation

## 2022-09-15 DIAGNOSIS — N951 Menopausal and female climacteric states: Secondary | ICD-10-CM | POA: Diagnosis not present

## 2022-09-15 DIAGNOSIS — Z0189 Encounter for other specified special examinations: Secondary | ICD-10-CM | POA: Diagnosis not present

## 2022-09-15 MED ORDER — VEOZAH 45 MG PO TABS: 1.0000 | ORAL_TABLET | Freq: Every day | ORAL | 2 refills | Status: DC

## 2022-09-15 NOTE — Progress Notes (Signed)
Paula Sherman 24-Apr-1976 [id number]   History:  47 y.o. G2P2 presents for annual exam and HRT f/u. Switched to estrogen patch and progesterone 3 months ago from oral estrogen however no improvement in symptoms. Hx of HTN, managed by PCP. Cologuard planning to complete tomorrow. Unsure when her last pap was, has yearly mammograms.   Gynecologic History Patient's last menstrual period was 08/29/2022 (approximate). Period Cycle (Days): 28 Period Duration (Days): 7 Period Pattern: Regular Menstrual Flow: Heavy Menstrual Control: Maxi pad Dysmenorrhea: (!) Severe Dysmenorrhea Symptoms: Cramping (lower back pain) Contraception/Family planning: coitus interruptus Sexually active: yes Last Pap: unsure Last mammogram: 11/2021  Obstetric History OB History  Gravida Para Term Preterm AB Living  '2 2       2  '$ SAB IAB Ectopic Multiple Live Births               # Outcome Date GA Lbr Len/2nd Weight Sex Delivery Anes PTL Lv  2 Para           1 Para              The following portions of the patient's history were reviewed and updated as appropriate: allergies, current medications, past family history, past medical history, past social history, past surgical history, and problem list.  Review of Systems Pertinent items noted in HPI and remainder of comprehensive ROS otherwise negative.   Past medical history, past surgical history, family history and social history were all reviewed and documented in the EPIC chart.   Exam:  Vitals:   09/15/22 1116  BP: 124/84  Weight: 202 lb (91.6 kg)  Height: 5' 5.5" (1.664 m)   Body mass index is 33.1 kg/m.  General appearance:  Normal Thyroid:  Symmetrical, normal in size, without palpable masses or nodularity. Respiratory  Auscultation:  Clear without wheezing or rhonchi Cardiovascular  Auscultation:  Regular rate, without rubs, murmurs or gallops  Edema/varicosities:  Not grossly evident Abdominal  Soft,nontender, without  masses, guarding or rebound.  Liver/spleen:  No organomegaly noted  Hernia:  None appreciated  Skin  Inspection:  Grossly normal Breasts: Examined lying and sitting.   Right: Without masses, retractions, nipple discharge or axillary adenopathy.   Left: Without masses, retractions, nipple discharge or axillary adenopathy. Genitourinary   Inguinal/mons:  Normal without inguinal adenopathy  External genitalia:  Normal appearing vulva with no masses, tenderness, or lesions  BUS/Urethra/Skene's glands:  Normal without masses or exudate  Vagina:  Normal appearing with normal color and discharge, no lesions  Cervix:  Normal appearing without discharge or lesions  Uterus:  Normal in size, shape and contour.  Mobile, nontender  Adnexa/parametria:     Rt: Normal in size, without masses or tenderness.   Lt: Normal in size, without masses or tenderness.  Anus and perineum: Normal   Patient informed chaperone available to be present for breast and pelvic exam. Patient has requested no chaperone to be present. Patient has been advised what will be completed during breast and pelvic exam.   Assessment/Plan:   1. Well woman exam with routine gynecological exam - Cytology - PAP( [place])  2. Vasomotor symptoms due to menopause Will switch to veozah to help with symptoms as it has no effect on BP Will check CMET first and repeat in 3 months - Fezolinetant (VEOZAH) 45 MG TABS; Take 1 tablet (45 mg total) by mouth daily.  Dispense: 30 tablet; Refill: 2  3. Encounter for laboratory test - Comp Met (CMET)  Discussed SBE, colonoscopy and DEXA screening as directed/appropriate. Recommend 115mns of exercise weekly, including weight bearing exercise. Encouraged the use of seatbelts and sunscreen. Return in 1 year for annual or as needed.   CRubbie BattiestB WHNP-BC 12:12 PM 09/15/2022

## 2022-09-16 ENCOUNTER — Telehealth: Payer: Self-pay

## 2022-09-16 LAB — COMPREHENSIVE METABOLIC PANEL
AG Ratio: 1.1 (calc) (ref 1.0–2.5)
ALT: 23 U/L (ref 6–29)
AST: 21 U/L (ref 10–35)
Albumin: 4.2 g/dL (ref 3.6–5.1)
Alkaline phosphatase (APISO): 71 U/L (ref 31–125)
BUN: 12 mg/dL (ref 7–25)
CO2: 25 mmol/L (ref 20–32)
Calcium: 9.3 mg/dL (ref 8.6–10.2)
Chloride: 103 mmol/L (ref 98–110)
Creat: 0.74 mg/dL (ref 0.50–0.99)
Globulin: 3.7 g/dL (calc) (ref 1.9–3.7)
Glucose, Bld: 86 mg/dL (ref 65–99)
Potassium: 3.9 mmol/L (ref 3.5–5.3)
Sodium: 138 mmol/L (ref 135–146)
Total Bilirubin: 0.6 mg/dL (ref 0.2–1.2)
Total Protein: 7.9 g/dL (ref 6.1–8.1)

## 2022-09-16 LAB — CYTOLOGY - PAP
Adequacy: ABSENT
Comment: NEGATIVE
Diagnosis: NEGATIVE
High risk HPV: NEGATIVE

## 2022-09-16 NOTE — Telephone Encounter (Signed)
I spoke to the patient and she stated her pharmacy let her know the [facility] prescription would be $599 with her insurance.  Patient was informed this was a new medication and the pharmacy should've processed a discount coupon.  Patient was instructed (per [facility]) to go to [email or web address] to print coupon and take to her pharmacy.  Prescription should be free for the first month and $30 thereafter.  Patient was instructed to call back if she had any problems getting her prescription for the discounted price.  Patient verbalized understanding and agreed with plan.

## 2022-09-16 NOTE — Telephone Encounter (Signed)
Pt called to report that the [place] rxd for her pm vasomotor sxs is $600 OOP.   FYI. I message Ruth about this also in case she receives a PA from pharmacy.

## 2022-09-17 ENCOUNTER — Ambulatory Visit (INDEPENDENT_AMBULATORY_CARE_PROVIDER_SITE_OTHER): Payer: BC Managed Care – PPO | Admitting: Radiology

## 2022-09-17 ENCOUNTER — Encounter: Payer: Self-pay | Admitting: Hematology & Oncology

## 2022-09-17 VITALS — BP 152/94

## 2022-09-17 DIAGNOSIS — Z113 Encounter for screening for infections with a predominantly sexual mode of transmission: Secondary | ICD-10-CM | POA: Diagnosis not present

## 2022-09-17 NOTE — Progress Notes (Signed)
      Subjective: Paula Sherman is a 47 y.o. female here for STI screening, trich found on pap, however patient reports she has not been sexually active recently with her husband.    Review of Systems  All other systems reviewed and are negative.   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   -General: no acute distress -Vulva: without lesions or discharge -Vagina: discharge present, sure swab  obtained -Cervix: no lesion or discharge, no CMT -Perineum: no lesions -Uterus: Mobile, non tender -Adnexa: no masses or tenderness   Chaperone offered and declined.  Assessment:/Plan:   1. Screening for STDs (sexually transmitted diseases)  - HIV antibody (with reflex) - RPR - Hepatitis C antibody - Hepatitis B Surface AntiGEN - SURESWAB CT/NG/T. vaginalis    Will contact patient with results of testing completed today.

## 2022-09-18 LAB — RPR: RPR Ser Ql: NONREACTIVE

## 2022-09-18 LAB — SURESWAB CT/NG/T. VAGINALIS
C. trachomatis RNA, TMA: NOT DETECTED
N. gonorrhoeae RNA, TMA: NOT DETECTED
Trichomonas vaginalis RNA: DETECTED — AB

## 2022-09-18 LAB — HIV ANTIBODY (ROUTINE TESTING W REFLEX): HIV 1&2 Ab, 4th Generation: NONREACTIVE

## 2022-09-18 LAB — HEPATITIS C ANTIBODY: Hepatitis C Ab: NONREACTIVE

## 2022-09-18 LAB — HEPATITIS B SURFACE ANTIGEN: Hepatitis B Surface Ag: NONREACTIVE

## 2022-09-21 ENCOUNTER — Other Ambulatory Visit: Payer: Self-pay

## 2022-09-21 MED ORDER — METRONIDAZOLE 500 MG PO TABS: 500.0000 mg | ORAL_TABLET | Freq: Two times a day (BID) | ORAL | 0 refills | Status: DC

## 2022-09-22 NOTE — Telephone Encounter (Signed)
I will reach out to the rep, unfortunately there is no alternative and she said she saw no improvement with HRT.

## 2022-09-22 NOTE — Progress Notes (Signed)
Thank you. Partner will need to be treated, abstain, TOC 6 weeks.

## 2022-09-22 NOTE — Telephone Encounter (Signed)
I called patient and left message in voice mail that Paula Crease, NP to talk with rep about this.

## 2022-09-22 NOTE — Telephone Encounter (Signed)
Patient called back to let Wende Crease, NP know that the pharmacy would not accept the coupon on the [facility] because her husband is a Teacher, music and because of his insurance.  She asks for an alternative.

## 2022-09-25 ENCOUNTER — Other Ambulatory Visit: Payer: PRIVATE HEALTH INSURANCE

## 2022-09-25 ENCOUNTER — Ambulatory Visit: Payer: Self-pay | Admitting: Hematology & Oncology

## 2022-09-30 ENCOUNTER — Ambulatory Visit (HOSPITAL_COMMUNITY): Payer: Self-pay | Admitting: Student in an Organized Health Care Education/Training Program
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
## 2022-10-05 ENCOUNTER — Other Ambulatory Visit: Payer: Self-pay | Admitting: *Deleted

## 2022-10-05 NOTE — Telephone Encounter (Signed)
Call returned to patient.   Patient requesting update on Veozah alternative. Per review of telephone encounter dated 09/16/22 -Jami to speak with rep.  Patient asking how long a female can live with trichomonas before detected?   Advised patient Paula Sherman is out of the office, I will forward to her to review and our office will f/u with recommendations once reviewed. Patient verbalizes understanding and agreeable.

## 2022-10-06 NOTE — Telephone Encounter (Signed)
I have reached out to the [facility] rep, Sam again this morning. Awaiting response. She tried HRT and said she noticed no symptoms improvement. Her other option if we can not get Veozah covered is trying paxil or prozac to help with symptoms.  Regarding the trichomonas, typically women will show symptoms after a few weeks. Increased discharge or a foul vaginal odor. She was asymptomatic so it is difficult to say how long she has had it.

## 2022-10-06 NOTE — Telephone Encounter (Signed)
Does she need a f/u OV?

## 2022-10-06 NOTE — Telephone Encounter (Signed)
Please send prozac 10mg  to take once daily at bedtime. #90 with 1 refill

## 2022-10-06 NOTE — Telephone Encounter (Signed)
I am seeing her in a few weeks for follow up on something else, will touch base at that visit. Thanks.

## 2022-10-06 NOTE — Telephone Encounter (Signed)
Spoke with patient, advised per Wende Crease, NP.  Patient would like to start paxil or Prozac, does not have a preference. Confirmed pharmacy on file, advised I will return call to notify once Rx has been sent and to advise on any additional instructions. Patient appreciative of f/u.  Routing to [place] to advise on Rx.

## 2022-10-06 NOTE — Telephone Encounter (Signed)
Paula Sherman, I spoke with patient and informed her of Rx and that you will follow up with her at her next visit scheduled.  I went to sign Rx and there are a lot of pop ups about other meds she is taking.  Please sign attached Rx.   Thanks

## 2022-10-07 NOTE — Telephone Encounter (Signed)
Spoke with patient and advised and she voiced understanding.

## 2022-10-07 NOTE — Telephone Encounter (Signed)
Cannot take both celexa and prozac together- she will need to talk to her ordering provider of the celexa about switching her to something that may work for both problems.

## 2022-10-12 ENCOUNTER — Encounter: Payer: Self-pay | Admitting: Family Medicine

## 2022-10-14 NOTE — Telephone Encounter (Signed)
Error/njr °

## 2022-10-15 ENCOUNTER — Encounter: Payer: Self-pay | Admitting: Hematology & Oncology

## 2022-10-16 ENCOUNTER — Encounter: Payer: Self-pay | Admitting: Hematology & Oncology

## 2022-10-16 ENCOUNTER — Encounter: Payer: Self-pay | Admitting: Family Medicine

## 2022-10-16 ENCOUNTER — Ambulatory Visit (INDEPENDENT_AMBULATORY_CARE_PROVIDER_SITE_OTHER): Payer: BC Managed Care – PPO | Admitting: Family Medicine

## 2022-10-16 VITALS — BP 144/100 | HR 83 | Temp 98.4°F | Ht 65.5 in | Wt 203.7 lb

## 2022-10-16 DIAGNOSIS — M5412 Radiculopathy, cervical region: Secondary | ICD-10-CM

## 2022-10-16 DIAGNOSIS — F411 Generalized anxiety disorder: Secondary | ICD-10-CM

## 2022-10-16 DIAGNOSIS — I10 Essential (primary) hypertension: Secondary | ICD-10-CM

## 2022-10-16 MED ORDER — AMLODIPINE BESYLATE 10 MG PO TABS: 10.0000 mg | ORAL_TABLET | Freq: Every day | ORAL | 1 refills | Status: DC

## 2022-10-16 MED ORDER — CHLORTHALIDONE 25 MG PO TABS
25.0000 mg | ORAL_TABLET | Freq: Every day | ORAL | 1 refills | Status: DC
Start: 2022-10-16 — End: 2023-03-29

## 2022-10-16 MED ORDER — CITALOPRAM HYDROBROMIDE 40 MG PO TABS: 40.0000 mg | ORAL_TABLET | Freq: Every day | ORAL | 1 refills | Status: DC

## 2022-10-16 NOTE — Progress Notes (Unsigned)
Established Patient Office Visit  Subjective   Patient ID: Paula Sherman, female    DOB: Oct 03, 1975  Age: 47 y.o. MRN: [id number]  Chief Complaint  Patient presents with   Anxiety    Patient states her GYN recommended she begin taking Prozac due to hot flashes and to see PCP since she is currently taking Citalopram    Pt reports that her anxiety is still very high. Her citalopram was decreased to 20 mg daily. She states that she was doing better on the 40 mg dose. We discussed increasing back up to the 40 mg and she is agreeable.   Pt is also continuing to report neck pain and pain that radiates down her right arm. She has tried multiple medications including NSAIDS and muscle relaxers without improvement. We discussed getting an EMG to look for nerve compression and she is agreeable to this plan.  HTN -- BP remains elevated today, she denies any chest pain, headaches or SOB,. She reports additional swelling in her feet and we discussed switching the amlodipine to chlorthalidone and she is agreeable.     Current Outpatient Medications  Medication Instructions   albuterol (VENTOLIN HFA) 108 (90 Base) MCG/ACT inhaler INHALE 2 PUFFS INTO LUNGS EVERY 6 HOURS AS NEEDED FOR WHEEZING FOR SHORTNESS OF BREATH   Ascorbic Acid (VITAMIN C PO) Oral, Daily   chlorthalidone (HYGROTON) 25 mg, Oral, Daily   citalopram (CELEXA) 40 mg, Oral, Daily   Cyanocobalamin (B-12 PO) Oral, Daily   diclofenac (VOLTAREN) 75 mg, Oral, 2 times daily   Ferrous Sulfate (IRON PO) Oral, Daily   fluticasone (FLONASE) 50 MCG/ACT nasal spray 2 sprays, Each Nare, Daily   metroNIDAZOLE (FLAGYL) 500 mg, Oral, 2 times daily   QUEtiapine (SEROQUEL) 75 mg, Oral, Daily at bedtime   Spacer/Aero-Holding Chambers DEVI 1 Device, Does not apply, Daily PRN   telmisartan (MICARDIS) 40 mg, Oral, Daily   traZODone (DESYREL) 100 mg, Oral, Daily at bedtime    Patient Active Problem List   Diagnosis Date Noted   GAD  (generalized anxiety disorder) 04/30/2022   Menopausal flushing 03/27/2022   Midline low back pain without sciatica 03/18/2022   Anxiety 02/13/2022   Chronic insomnia 02/13/2022   HTN (hypertension) 02/13/2022   B12 deficiency 02/13/2022   Iron deficiency anemia due to chronic blood loss 09/12/2018   Menometrorrhagia 09/12/2018   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     Objective:     BP (!) 144/100 (BP Location: Left Arm, Patient Position: Sitting, Cuff Size: Normal)   Pulse 83   Temp 98.4 F (36.9 C) (Oral)   Ht 5' 5.5" (1.664 m)   Wt 203 lb 11.2 oz (92.4 kg)   LMP 09/18/2022 (Exact Date)   SpO2 99%   BMI 33.38 kg/m    Physical Exam Vitals reviewed.  Constitutional:      Appearance: Normal appearance. She is well-groomed and normal weight.  Eyes:     Conjunctiva/sclera: Conjunctivae normal.  Cardiovascular:     Rate and Rhythm: Normal rate and regular rhythm.     Pulses: Normal pulses.     Heart sounds: S1 normal and S2 normal.  Pulmonary:     Effort: Pulmonary effort is normal.     Breath sounds: Normal breath sounds and air entry.  Abdominal:     General: Bowel sounds are normal.  Neurological:  Mental Status: She is alert and oriented to person, place, and time. Mental status is at baseline.     Gait: Gait is intact.  Psychiatric:        Mood and Affect: Mood and affect normal.        Speech: Speech normal.        Behavior: Behavior normal.        Judgment: Judgment normal.      No results found for any visits on 10/16/22.    The 10-year ASCVD risk score (Arnett DK, et al., 2019) is: 5.8%    Assessment & Plan:   Problem List Items Addressed This Visit       Unprioritized   HTN (hypertension) (Chronic)    Current hypertension medications:       Sig   chlorthalidone (HYGROTON) 25 MG tablet (Taking) Take 1 tablet (25 mg total) by mouth daily.    telmisartan (MICARDIS) 40 MG tablet (Taking) Take 1 tablet (40 mg total) by mouth daily.     Elevated, will stop the amlodipine and start chlorthalidone 25 mg daily. Will see her back in 3 months for a follow up and BP check      Relevant Medications   chlorthalidone (HYGROTON) 25 MG tablet   GAD (generalized anxiety disorder)    Uncontrolled symptoms, will increase the citalopram to 40 mg daily since her symptoms were better controlled       Relevant Medications   citalopram (CELEXA) 40 MG tablet   Other Visit Diagnoses     Cervical radiculopathy    -  Primary   sending for EMG to confirm nerve impingement   Relevant Medications   citalopram (CELEXA) 40 MG tablet   Other Relevant Orders   Ambulatory referral to Neurology   DG Cervical Spine Complete       Return in about 2 months (around 12/16/2022) for HTN.    Karie Georges, MD

## 2022-10-16 NOTE — Patient Instructions (Signed)
STOP amlodipine -- possibly causing hot flashes  START chlorthalidone 25 mg once daily in the morning  Continue the telmisartan 40 mg daily  Increasing citalopram back to 40 mg daily

## 2022-10-18 ENCOUNTER — Other Ambulatory Visit: Payer: Self-pay | Admitting: Family Medicine

## 2022-10-18 DIAGNOSIS — F411 Generalized anxiety disorder: Secondary | ICD-10-CM

## 2022-10-18 DIAGNOSIS — I10 Essential (primary) hypertension: Secondary | ICD-10-CM

## 2022-10-19 NOTE — Assessment & Plan Note (Signed)
Current hypertension medications:       Sig   chlorthalidone (HYGROTON) 25 MG tablet (Taking) Take 1 tablet (25 mg total) by mouth daily.   telmisartan (MICARDIS) 40 MG tablet (Taking) Take 1 tablet (40 mg total) by mouth daily.      Elevated, will stop the amlodipine and start chlorthalidone 25 mg daily. Will see her back in 3 months for a follow up and BP check

## 2022-10-19 NOTE — Assessment & Plan Note (Signed)
Uncontrolled symptoms, will increase the citalopram to 40 mg daily since her symptoms were better controlled

## 2022-10-19 NOTE — Assessment & Plan Note (Signed)
Increasing citalopram back to 40 mg daily, continue seroquel 75 mg daily at bedtime.

## 2022-10-20 ENCOUNTER — Other Ambulatory Visit: Payer: BC Managed Care – PPO

## 2022-10-20 ENCOUNTER — Ambulatory Visit (INDEPENDENT_AMBULATORY_CARE_PROVIDER_SITE_OTHER): Payer: BC Managed Care – PPO

## 2022-10-20 DIAGNOSIS — M5412 Radiculopathy, cervical region: Secondary | ICD-10-CM

## 2022-10-22 ENCOUNTER — Other Ambulatory Visit: Payer: Self-pay

## 2022-10-22 ENCOUNTER — Encounter: Payer: Self-pay | Admitting: Neurology

## 2022-10-22 DIAGNOSIS — R202 Paresthesia of skin: Secondary | ICD-10-CM

## 2022-11-03 ENCOUNTER — Ambulatory Visit (INDEPENDENT_AMBULATORY_CARE_PROVIDER_SITE_OTHER): Payer: BC Managed Care – PPO | Admitting: Radiology

## 2022-11-03 VITALS — BP 128/86

## 2022-11-03 DIAGNOSIS — A599 Trichomoniasis, unspecified: Secondary | ICD-10-CM | POA: Diagnosis not present

## 2022-11-03 LAB — WET PREP FOR TRICH, YEAST, CLUE

## 2022-11-03 NOTE — Progress Notes (Signed)
      Subjective: Paula Sherman is a 47 y.o. female here for [facility] trich, she and her partner were both treated. Negative (other) STI screen at last visit    Review of Systems  All other systems reviewed and are negative.   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   -General: no acute distress -Vulva: without lesions or discharge -Vagina: scant discharge present, wet prep obtained -Cervix: no lesion or discharge, no CMT -Perineum: no lesions -Uterus: Mobile, non tender -Adnexa: no masses or tenderness   Microscopic wet-mount exam shows negative for pathogens, normal epithelial cells.   Raynelle Fanning, CMA present for exam  Assessment:/Plan:  1. Trichomoniasis Resolved Safe sex encouraged.

## 2022-11-04 ENCOUNTER — Ambulatory Visit: Payer: BC Managed Care – PPO | Admitting: Family Medicine

## 2022-11-04 ENCOUNTER — Encounter: Payer: Self-pay | Admitting: Family Medicine

## 2022-11-04 VITALS — BP 124/78 | HR 90 | Temp 97.5°F | Ht 65.5 in | Wt 204.0 lb

## 2022-11-04 DIAGNOSIS — F419 Anxiety disorder, unspecified: Secondary | ICD-10-CM

## 2022-11-04 DIAGNOSIS — M545 Low back pain, unspecified: Secondary | ICD-10-CM

## 2022-11-04 MED ORDER — QUETIAPINE FUMARATE 100 MG PO TABS
100.0000 mg | ORAL_TABLET | Freq: Every day | ORAL | 1 refills | Status: DC
Start: 2022-11-04 — End: 2022-11-04

## 2022-11-04 MED ORDER — QUETIAPINE FUMARATE ER 150 MG PO TB24: 150.0000 mg | ORAL_TABLET | Freq: Every day | ORAL | 1 refills | Status: DC

## 2022-11-04 MED ORDER — DICLOFENAC SODIUM 75 MG PO TBEC
75.0000 mg | DELAYED_RELEASE_TABLET | Freq: Two times a day (BID) | ORAL | 5 refills | Status: DC
Start: 2022-11-04 — End: 2022-11-04

## 2022-11-04 MED ORDER — DICLOFENAC SODIUM 75 MG PO TBEC: 75.0000 mg | DELAYED_RELEASE_TABLET | Freq: Two times a day (BID) | ORAL | 5 refills | Status: DC

## 2022-11-04 MED ORDER — DIAZEPAM 5 MG PO TABS
5.0000 mg | ORAL_TABLET | Freq: Once | ORAL | 0 refills | Status: DC | PRN
Start: 2022-11-04 — End: 2022-11-17

## 2022-11-04 NOTE — Progress Notes (Signed)
Established Patient Office Visit  Subjective   Patient ID: Paula Sherman, female    DOB: [birth year]  Age: 47 y.o. MRN: [id number]  Chief Complaint  Patient presents with   Medical Management of Chronic Issues    Patient states that her anxiety is "through the roof", she reports that increasing back to the 40 mg citalopram did not work for her at all. States that she feels like she is "screaming from the inside", states that she doesn't wind down until she takes her medication at night.   Pt reports that her appt for her shoulder is Friday. States she needs something to help her relax before    Current Outpatient Medications  Medication Instructions   albuterol (VENTOLIN HFA) 108 (90 Base) MCG/ACT inhaler INHALE 2 PUFFS INTO LUNGS EVERY 6 HOURS AS NEEDED FOR WHEEZING FOR SHORTNESS OF BREATH   amLODipine (NORVASC) 10 mg, Oral, Daily at bedtime   Ascorbic Acid (VITAMIN C PO) Oral, Daily   chlorthalidone (HYGROTON) 25 mg, Oral, Daily   citalopram (CELEXA) 40 mg, Oral, Daily   Cyanocobalamin (B-12 PO) Oral, Daily   diazepam (VALIUM) 5 mg, Oral, Once PRN   diclofenac (VOLTAREN) 75 mg, Oral, 2 times daily   Ferrous Sulfate (IRON PO) Oral, Daily   fluticasone (FLONASE) 50 MCG/ACT nasal spray 2 sprays, Each Nare, Daily   QUEtiapine Fumarate (SEROQUEL XR) 150 mg, Oral, Daily at bedtime   Spacer/Aero-Holding Chambers DEVI 1 Device, Does not apply, Daily PRN   telmisartan (MICARDIS) 40 mg, Oral, Daily   traZODone (DESYREL) 100 mg, Oral, Daily at bedtime     Patient Active Problem List   Diagnosis Date Noted   GAD (generalized anxiety disorder) 04/30/2022   Menopausal flushing 03/27/2022   Midline low back pain without sciatica 03/18/2022   Anxiety 02/13/2022   Chronic insomnia 02/13/2022   HTN (hypertension) 02/13/2022   B12 deficiency 02/13/2022   Iron deficiency anemia due to chronic blood loss 09/12/2018   Menometrorrhagia 09/12/2018   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     Objective:     BP 124/78 (BP Location: Left Arm, Patient Position: Sitting, Cuff Size: Large)   Pulse 90   Temp (!) 97.5 F (36.4 C) (Oral)   Ht 5' 5.5" (1.664 m)   Wt 204 lb (92.5 kg)   LMP 11/03/2022 (Exact Date)   SpO2 98%   BMI 33.43 kg/m    Physical Exam Vitals reviewed.  Constitutional:      Appearance: Normal appearance. She is well-groomed and normal weight.  Eyes:     Conjunctiva/sclera: Conjunctivae normal.  Pulmonary:     Effort: Pulmonary effort is normal.     Breath sounds: Normal breath sounds and air entry.  Neurological:     Mental Status: She is alert and oriented to person, place, and time. Mental status is at baseline.     Gait: Gait is intact.  Psychiatric:        Mood and Affect: Affect normal. Mood is anxious.        Speech: Speech normal.        Behavior: Behavior normal.        Judgment: Judgment normal.      No results found for any visits on 11/04/22.    The 10-year ASCVD risk score (Arnett DK, et al., 2019) is: 3%    Assessment & Plan:  Problem List Items Addressed This Visit       Unprioritized   Anxiety - Primary (Chronic)    Severe, uncontrolled with 40 mg citalopram and 75 mg seroquel at night, will continue the 40 mg citalopram and increase the seroquel to 150 mg XR daily at bedtime. Pt instructed to send me a message in 2 weeks to let me know if her symptoms have improved.       Relevant Medications   diazepam (VALIUM) 5 MG tablet   Midline low back pain without sciatica (Chronic)   Relevant Medications   diclofenac (VOLTAREN) 75 MG EC tablet    Return in about 3 months (around 02/03/2023) for anxiety.    Karie Georges, MD

## 2022-11-04 NOTE — Patient Instructions (Addendum)
Switching to seroquel XR 150 mg -- take it at bedtime.   Send me a message in 2 weeks to let me know how you are doing.

## 2022-11-04 NOTE — Assessment & Plan Note (Signed)
Severe, uncontrolled with 40 mg citalopram and 75 mg seroquel at night, will continue the 40 mg citalopram and increase the seroquel to 150 mg XR daily at bedtime. Pt instructed to send me a message in 2 weeks to let me know if her symptoms have improved.

## 2022-11-06 ENCOUNTER — Ambulatory Visit (INDEPENDENT_AMBULATORY_CARE_PROVIDER_SITE_OTHER): Payer: BC Managed Care – PPO | Admitting: Neurology

## 2022-11-06 ENCOUNTER — Encounter: Payer: Self-pay | Admitting: Hematology & Oncology

## 2022-11-06 ENCOUNTER — Inpatient Hospital Stay (HOSPITAL_BASED_OUTPATIENT_CLINIC_OR_DEPARTMENT_OTHER): Payer: BC Managed Care – PPO | Admitting: Hematology & Oncology

## 2022-11-06 ENCOUNTER — Inpatient Hospital Stay: Payer: BC Managed Care – PPO | Attending: Hematology & Oncology

## 2022-11-06 VITALS — HR 81 | Temp 97.8°F | Resp 20 | Ht 67.0 in | Wt 206.0 lb

## 2022-11-06 DIAGNOSIS — R7989 Other specified abnormal findings of blood chemistry: Secondary | ICD-10-CM | POA: Diagnosis not present

## 2022-11-06 DIAGNOSIS — R202 Paresthesia of skin: Secondary | ICD-10-CM | POA: Diagnosis not present

## 2022-11-06 DIAGNOSIS — D709 Neutropenia, unspecified: Secondary | ICD-10-CM

## 2022-11-06 DIAGNOSIS — D72819 Decreased white blood cell count, unspecified: Secondary | ICD-10-CM | POA: Diagnosis not present

## 2022-11-06 DIAGNOSIS — E538 Deficiency of other specified B group vitamins: Secondary | ICD-10-CM | POA: Diagnosis not present

## 2022-11-06 DIAGNOSIS — D500 Iron deficiency anemia secondary to blood loss (chronic): Secondary | ICD-10-CM

## 2022-11-06 LAB — IRON AND IRON BINDING CAPACITY (CC-WL,HP ONLY)
Iron: 90 ug/dL (ref 28–170)
Saturation Ratios: 32 % — ABNORMAL HIGH (ref 10.4–31.8)
TIBC: 281 ug/dL (ref 250–450)
UIBC: 191 ug/dL (ref 148–442)

## 2022-11-06 LAB — CMP (CANCER CENTER ONLY)
ALT: 58 U/L — ABNORMAL HIGH (ref 0–44)
AST: 47 U/L — ABNORMAL HIGH (ref 15–41)
Albumin: 4.2 g/dL (ref 3.5–5.0)
Alkaline Phosphatase: 80 U/L (ref 38–126)
Anion gap: 7 (ref 5–15)
BUN: 13 mg/dL (ref 6–20)
CO2: 30 mmol/L (ref 22–32)
Calcium: 9.6 mg/dL (ref 8.9–10.3)
Chloride: 103 mmol/L (ref 98–111)
Creatinine: 0.87 mg/dL (ref 0.44–1.00)
GFR, Estimated: >60 mL/min (ref >60)
Glucose, Bld: 86 mg/dL (ref 70–99)
Potassium: 3.2 mmol/L — ABNORMAL LOW (ref 3.5–5.1)
Sodium: 140 mmol/L (ref 135–145)
Total Bilirubin: 0.7 mg/dL (ref 0.3–1.2)
Total Protein: 7.9 g/dL (ref 6.5–8.1)

## 2022-11-06 LAB — CBC WITH DIFFERENTIAL (CANCER CENTER ONLY)
Abs Immature Granulocytes: 0 10*3/uL (ref 0.00–0.07)
Basophils Absolute: 0 10*3/uL (ref 0.0–0.1)
Basophils Relative: 1 %
Eosinophils Absolute: 0.1 10*3/uL (ref 0.0–0.5)
Eosinophils Relative: 3 %
HCT: 40.7 % (ref 36.0–46.0)
Hemoglobin: 13.4 g/dL (ref 12.0–15.0)
Immature Granulocytes: 0 %
Lymphocytes Relative: 40 %
Lymphs Abs: 1.2 10*3/uL (ref 0.7–4.0)
MCH: 29.1 pg (ref 26.0–34.0)
MCHC: 32.9 g/dL (ref 30.0–36.0)
MCV: 88.5 fL (ref 80.0–100.0)
Monocytes Absolute: 0.4 10*3/uL (ref 0.1–1.0)
Monocytes Relative: 13 %
Neutro Abs: 1.3 10*3/uL — ABNORMAL LOW (ref 1.7–7.7)
Neutrophils Relative %: 43 %
Platelet Count: 250 10*3/uL (ref 150–400)
RBC: 4.6 MIL/uL (ref 3.87–5.11)
RDW: 12.4 % (ref 11.5–15.5)
WBC Count: 2.9 10*3/uL — ABNORMAL LOW (ref 4.0–10.5)
nRBC: 0 % (ref 0.0–0.2)

## 2022-11-06 LAB — SAVE SMEAR(SSMR), FOR PROVIDER SLIDE REVIEW

## 2022-11-06 LAB — FERRITIN: Ferritin: 131 ng/mL (ref 11–307)

## 2022-11-06 NOTE — Procedures (Signed)
  [facility]  [address] [place], [address]  [place], [place] [address] Tel: [phone] Fax: [phone] Test Date:  11/06/2022  Patient: Paula Sherman DOB: Nov 17, 1975 Physician: Nita Sickle, DO  Sex: Female Height: 5\' 5"  Ref Phys: Nira Conn, MD  ID#: [id number]   Technician:    History: This is a 47 year old female referred for evaluation of right arm paresthesias and pain.  NCV & EMG Findings: Extensive electrodiagnostic testing of the right upper extremity shows: Right median, ulnar, and mixed palmar sensory responses are within normal limits. Right median and ulnar motor responses are within normal limits. There is no evidence of active or chronic motor axonal loss changes affecting any of the tested muscles.  Motor unit configuration and recruitment pattern is within normal limits.  Impression: This is a normal study of the right upper extremity.  In particular, there is no evidence of a cervical radiculopathy or carpal tunnel syndrome.   ___________________________ Nita Sickle, DO    Nerve Conduction Studies   Stim Site NR Peak (ms) Norm Peak (ms) O-P Amp (V) Norm O-P Amp  Right Median Anti Sensory (2nd Digit)  32 C  Wrist    2.9 <3.4 34.0 >20  Right Ulnar Anti Sensory (5th Digit)  32 C  Wrist    2.5 <3.1 34.0 >12     Stim Site NR Onset (ms) Norm Onset (ms) O-P Amp (mV) Norm O-P Amp Site1 Site2 Delta-0 (ms) Dist (cm) Vel (m/s) Norm Vel (m/s)  Right Median Motor (Abd Poll Brev)  32 C  Wrist    3.2 <3.9 12.6 >6 Elbow Wrist 4.9 29.0 59 >50  Elbow    8.1  12.2         Right Ulnar Motor (Abd Dig Minimi)  32 C  Wrist    2.3 <3.1 11.0 >7 B Elbow Wrist 3.6 21.0 58 >50  B Elbow    5.9  10.4  A Elbow B Elbow 1.2 8.0 67 >50  A Elbow    7.1  9.8            Stim Site NR Peak (ms) Norm Peak (ms) P-T Amp (V) Site1 Site2 Delta-P (ms) Norm Delta (ms)  Right Median/Ulnar Palm Comparison (Wrist - 8cm)  32 C  Median Palm    1.7 <2.2 66.0 Median  Palm Ulnar Palm 0.1   Ulnar Palm    1.6 <2.2 10.1       Electromyography   Side Muscle Ins.Act Fibs Fasc Recrt Amp Dur Poly Activation Comment  Right 1stDorInt Nml Nml Nml Nml Nml Nml Nml Nml N/A  Right PronatorTeres Nml Nml Nml Nml Nml Nml Nml Nml N/A  Right Biceps Nml Nml Nml Nml Nml Nml Nml Nml N/A  Right Triceps Nml Nml Nml Nml Nml Nml Nml Nml N/A  Right Deltoid Nml Nml Nml Nml Nml Nml Nml Nml N/A      Waveforms:

## 2022-11-06 NOTE — Progress Notes (Signed)
[name]  Paula Sherman [id number] 1975-11-18 47 y.o. 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  We saw Paula Sherman back in November.  Since then, Paula Sherman been doing quite well.  Paula Sherman comes in with Paula Sherman husband.  When we saw, we did some test for the leukopenia.  So far, everything has been okay.  Paula Sherman is not B12 deficient.  Paula Sherman is not iron deficient.  Today, Paula Sherman labs show that Paula Sherman has mild elevation of Paula Sherman LFTs.  Paula Sherman is not on any kind of statin drug.  Paula Sherman said that Paula Sherman was started on a new blood pressure medicine.  I think that we are going to have to repeat the LFTs in a month.  Paula Sherman has had no nausea or vomiting.  There is no change in bowel or bladder habits.  Paula Sherman has had no bleeding.  Is been no fever.  Paula Sherman has had no cough or shortness of breath.  There is been no issues with COVID.  Paula Sherman has had no leg swelling.  Paula Sherman has had no rashes.  Overall, I would say that Paula Sherman performance status is probably ECOG 0.  Medications:  Current Outpatient Medications:    albuterol (VENTOLIN HFA) 108 (90 Base) MCG/ACT inhaler, INHALE 2 PUFFS INTO LUNGS EVERY 6 HOURS AS NEEDED FOR WHEEZING FOR SHORTNESS OF BREATH, Disp: 9 g, Rfl: 0   amLODipine (NORVASC) 10 MG tablet, Take 10 mg by mouth at bedtime., Disp: , Rfl:    Ascorbic Acid (VITAMIN C PO), Take by mouth daily., Disp: , Rfl:    chlorthalidone (HYGROTON) 25 MG tablet, Take 1 tablet (25 mg total) by mouth daily., Disp: 90 tablet, Rfl: 1   citalopram (CELEXA) 40 MG tablet, Take 1 tablet (40 mg total) by mouth daily., Disp: 90 tablet, Rfl: 1   Cyanocobalamin (B-12 PO), Take by mouth daily., Disp: , Rfl:    diclofenac (VOLTAREN) 75 MG EC tablet, Take 1 tablet (75 mg total) by mouth 2 (two) times daily., Disp: 60 tablet, Rfl: 5   Ferrous Sulfate (IRON PO), Take by mouth daily., Disp:  , Rfl:    fluticasone (FLONASE) 50 MCG/ACT nasal spray, Place 2 sprays into both nostrils daily., Disp: 16 g, Rfl: 0   QUEtiapine Fumarate (SEROQUEL XR) 150 MG 24 hr tablet, Take 1 tablet (150 mg total) by mouth at bedtime., Disp: 90 tablet, Rfl: 1   Spacer/Aero-Holding Chambers DEVI, 1 Device by Does not apply route daily as needed., Disp: 1 each, Rfl: 0   telmisartan (MICARDIS) 40 MG tablet, Take 1 tablet (40 mg total) by mouth daily., Disp: 90 tablet, Rfl: 1   traZODone (DESYREL) 100 MG tablet, Take 1 tablet (100 mg total) by mouth at bedtime., Disp: 90 tablet, Rfl: 1   diazepam (VALIUM) 5 MG tablet, Take 1 tablet (5 mg total) by mouth once as needed for up to 1 dose for anxiety. (Patient not taking: Reported on 11/06/2022), Disp: 2 tablet, Rfl: 0  Allergies:  Allergies  Allergen Reactions   Penicillins Anaphylaxis   Latex Rash    Past Medical History, Surgical history, Social history, and Family History were reviewed and updated.  Review of Systems: Review of Systems  Constitutional: Negative.   HENT:  Negative.    Eyes: Negative.   Respiratory: Negative.    Cardiovascular: Negative.   Gastrointestinal: Negative.   Endocrine:  Negative.   Genitourinary: Negative.    Musculoskeletal: Negative.   Skin: Negative.   Neurological: Negative.   Hematological: Negative.   Psychiatric/Behavioral: Negative.      Physical Exam:  height is 5\' 7"  (1.702 m) and weight is 206 lb (93.4 kg). Paula Sherman oral temperature is 97.8 F (36.6 C). Paula Sherman pulse is 81. Paula Sherman respiration is 20 and oxygen saturation is 100%.   Wt Readings from Last 3 Encounters:  11/06/22 206 lb (93.4 kg)  11/04/22 204 lb (92.5 kg)  10/16/22 203 lb 11.2 oz (92.4 kg)    Physical Exam Vitals reviewed.  HENT:     Head: Normocephalic and atraumatic.  Eyes:     Pupils: Pupils are equal, round, and reactive to light.  Cardiovascular:     Rate and Rhythm: Normal rate and regular rhythm.     Heart sounds: Normal heart sounds.   Pulmonary:     Effort: Pulmonary effort is normal.     Breath sounds: Normal breath sounds.  Abdominal:     General: Bowel sounds are normal.     Palpations: Abdomen is soft.  Musculoskeletal:        General: No tenderness or deformity. Normal range of motion.     Cervical back: Normal range of motion.  Lymphadenopathy:     Cervical: No cervical adenopathy.  Skin:    General: Skin is warm and dry.     Findings: No erythema or rash.  Neurological:     Mental Status: Paula Sherman is alert and oriented to person, place, and time.  Psychiatric:        Behavior: Behavior normal.        Thought Content: Thought content normal.        Judgment: Judgment normal.      Lab Results  Component Value Date   WBC 2.9 (L) 11/06/2022   HGB 13.4 11/06/2022   HCT 40.7 11/06/2022   MCV 88.5 11/06/2022   PLT 250 11/06/2022     Chemistry      Component Value Date/Time   NA 140 11/06/2022 0822   K 3.2 (L) 11/06/2022 0822   CL 103 11/06/2022 0822   CO2 30 11/06/2022 0822   BUN 13 11/06/2022 0822   CREATININE 0.87 11/06/2022 0822   CREATININE 0.74 09/15/2022 1146      Component Value Date/Time   CALCIUM 9.6 11/06/2022 0822   ALKPHOS 80 11/06/2022 0822   AST 47 (H) 11/06/2022 0822   ALT 58 (H) 11/06/2022 0822   BILITOT 0.7 11/06/2022 1610      Impression and Plan: Paula Sherman is a very charming 47 year old African-American female.  Paula Sherman has mild leukopenia.  Everything is stable.  I had to believe that this again to be Ethnic Associated Leukopenia.  By Paula Sherman blood smear, this appears to be consistent with EAL.  Again Paula Sherman is asymptomatic with this.  I am not sure why the LFTs are little bit elevated.  Again we will have to check this out.  We will do some LFTs on in a month.  I will still plan to see Paula Sherman back in about 4 months with respect to Paula Sherman leukopenia.  Do not see any thing on Paula Sherman blood smear that would be suggestive of a underlying hematologic malignancy (i.e.  myelodysplasia).  As always, it is somewhat fun talking to Paula Sherman and Paula Sherman husband.   Paula Macho, MD 4/26/20249:40 AM

## 2022-11-09 ENCOUNTER — Encounter: Payer: Self-pay | Admitting: Hematology & Oncology

## 2022-11-10 ENCOUNTER — Encounter: Payer: Self-pay | Admitting: Family Medicine

## 2022-11-10 ENCOUNTER — Ambulatory Visit (INDEPENDENT_AMBULATORY_CARE_PROVIDER_SITE_OTHER): Payer: BC Managed Care – PPO | Admitting: Family Medicine

## 2022-11-10 VITALS — BP 110/80 | HR 90 | Temp 98.3°F

## 2022-11-10 DIAGNOSIS — M5412 Radiculopathy, cervical region: Secondary | ICD-10-CM

## 2022-11-10 MED ORDER — METHYLPREDNISOLONE 4 MG PO TBPK: ORAL_TABLET | ORAL | 0 refills | Status: DC

## 2022-11-10 NOTE — Progress Notes (Unsigned)
   Subjective:    Patient ID: Paula Sherman, female    DOB: [birth year], 47 y.o.   MRN: [id number]  HPI Here to follow up on pain that starts in the right lower neck and radiates down the right arm to the elbow. This started about 3 months ago. No hx of trauma. She also has intermittent numbness and tingling in the right hand and fingers. She had cervical spine Xrays on 10-20-22 that showed mild degenerative changes but also osteophytes. She then had a NCV-EMG on 11-06-22 that was normal. She has tried Tylenol and Diclofenac with no relief.    Review of Systems  Constitutional: Negative.   Respiratory: Negative.    Cardiovascular: Negative.   Musculoskeletal:  Positive for neck pain.  Neurological:  Positive for numbness. Negative for weakness.       Objective:   Physical Exam Constitutional:      Appearance: Normal appearance.  Cardiovascular:     Rate and Rhythm: Normal rate and regular rhythm.     Pulses: Normal pulses.     Heart sounds: Normal heart sounds.  Pulmonary:     Effort: Pulmonary effort is normal.     Breath sounds: Normal breath sounds.  Musculoskeletal:     Cervical back: Neck supple.     Comments: She is very tender along the right side of the cervical spine and in the right upper back toward the scapula. The right shoulder is mildly tender in the anterior portion. There is no crepitus , and ROM of the right shoulder is full.   Neurological:     Mental Status: She is alert.           Assessment & Plan:  Cervical radiculopathy, likely due to the bone spurs. We will treat this with a Medrol dose pack. Follow up as needed.  Gershon Crane, MD

## 2022-11-11 ENCOUNTER — Telehealth: Payer: Self-pay | Admitting: Family Medicine

## 2022-11-11 DIAGNOSIS — M5412 Radiculopathy, cervical region: Secondary | ICD-10-CM

## 2022-11-11 DIAGNOSIS — M545 Low back pain, unspecified: Secondary | ICD-10-CM

## 2022-11-11 NOTE — Telephone Encounter (Signed)
They were normal which means no nerve impingement was seen on the test. I sent her a message over the My chart offering her a referral to sports medicine for evaluation. Please call pt to let her know. Thanks!

## 2022-11-11 NOTE — Telephone Encounter (Signed)
Patient informed of the results as below.  Patient states she was seen by Dr Clent Ridges and told she did have a pinched nerve.  Patient stated she is upset that during the last visit PCP did not even touch her, just asked questions and told her a test would be ordered.  Patient also expressed concern that PCP was to get back with her regarding hot flashes and did not do so.  Message sent to the practice administrator per patient's request.

## 2022-11-11 NOTE — Telephone Encounter (Signed)
Requesting a call regarding her nerve test results.

## 2022-11-12 ENCOUNTER — Encounter: Payer: BC Managed Care – PPO | Admitting: Neurology

## 2022-11-12 NOTE — Telephone Encounter (Signed)
Spoke with patient regarding her concerns that PCP did not "physically" exam her shoulder pain & that there was no communication about recent tests/procedures that were completed. She originally had requests to change PCP, however, after review of chart & sharing with pt that PCP sent result note to her in [place] which patient has not checked, she has changed her mind. States PCP has been helpful regarding her anxiety & moving forward she would like result notes called to her vs put in a mychart message. Pt encouraged to use mychart; given benefits of it's use. Pt verb understanding. 3 month f/u appt made per 11/04/22 OV.

## 2022-11-13 NOTE — Telephone Encounter (Signed)
Did she mention wether she would like a referral to sports medicine? It was in the comment that I sent her on Mychart.

## 2022-11-13 NOTE — Addendum Note (Signed)
Addended by: Claudette Laws D on: 11/13/2022 12:33 PM   Modules accepted: Orders

## 2022-11-16 ENCOUNTER — Telehealth: Payer: Self-pay | Admitting: Family Medicine

## 2022-11-16 NOTE — Telephone Encounter (Signed)
Prescription Request  11/16/2022  LOV: 11/04/2022  What is the name of the medication or equipment? Medication to treat hot flashes   Have you contacted your pharmacy to request a refill? Yes   Which pharmacy would you like this sent to?  [id number] - [place] ([place]), [place] - [address] [place] ([place]) [place] [address] Phone: [phone] Fax: [phone]    Patient notified that their request is being sent to the clinical staff for review and that they should receive a response within 2 business days.   Please advise at Mobile [phone] (mobile)

## 2022-11-17 ENCOUNTER — Other Ambulatory Visit: Payer: Self-pay | Admitting: Family Medicine

## 2022-11-17 DIAGNOSIS — N951 Menopausal and female climacteric states: Secondary | ICD-10-CM

## 2022-11-17 MED ORDER — VEOZAH 45 MG PO TABS
1.0000 | ORAL_TABLET | Freq: Every day | ORAL | 5 refills | Status: DC
Start: 2022-11-17 — End: 2022-11-18

## 2022-11-17 NOTE — Telephone Encounter (Signed)
Pt is calling and Veozah cost with insurance 599.00 and pt would like something else sent to pharm

## 2022-11-17 NOTE — Telephone Encounter (Signed)
I called in Veozah-- if this does not work for the patient then she will need to see the gynecologist to discuss her hot flashes.

## 2022-11-17 NOTE — Telephone Encounter (Signed)
There really isn't anything else I can give her-- we already tried hormone replacement and she is already on a SSRI. Only thing we can do at this point is refer her to GYN. If she is agreeable to this plan then ok to place erferral to GYN for hot flashes

## 2022-11-17 NOTE — Telephone Encounter (Signed)
I left a detailed message with the information below at the patient's cell number. ?

## 2022-11-18 ENCOUNTER — Other Ambulatory Visit: Payer: Self-pay | Admitting: Family Medicine

## 2022-11-18 NOTE — Progress Notes (Deleted)
    Aleen Sells D.[name] [address] [place] [address] Phone: [phone]   Assessment and Plan:     There are no diagnoses linked to this encounter.  ***   Pertinent previous records reviewed include ***   Follow Up: ***     Subjective:   I, Paula Sherman, am serving as a Neurosurgeon for Doctor Richardean Sale  Chief Complaint: neck pain   HPI:   11/19/2022 Patient is a 47 year old female complaining of neck pain. Patient states  Relevant Historical Information: ***  Additional pertinent review of systems negative.   Current Outpatient Medications:    albuterol (VENTOLIN HFA) 108 (90 Base) MCG/ACT inhaler, INHALE 2 PUFFS INTO LUNGS EVERY 6 HOURS AS NEEDED FOR WHEEZING FOR SHORTNESS OF BREATH, Disp: 9 g, Rfl: 0   amLODipine (NORVASC) 10 MG tablet, Take 10 mg by mouth at bedtime., Disp: , Rfl:    Ascorbic Acid (VITAMIN C PO), Take by mouth daily., Disp: , Rfl:    chlorthalidone (HYGROTON) 25 MG tablet, Take 1 tablet (25 mg total) by mouth daily., Disp: 90 tablet, Rfl: 1   citalopram (CELEXA) 40 MG tablet, Take 1 tablet (40 mg total) by mouth daily., Disp: 90 tablet, Rfl: 1   Cyanocobalamin (B-12 PO), Take by mouth daily., Disp: , Rfl:    diclofenac (VOLTAREN) 75 MG EC tablet, Take 1 tablet (75 mg total) by mouth 2 (two) times daily., Disp: 60 tablet, Rfl: 5   Ferrous Sulfate (IRON PO), Take by mouth daily., Disp: , Rfl:    Fezolinetant (VEOZAH) 45 MG TABS, Take 1 tablet (45 mg total) by mouth daily at 6 (six) AM., Disp: 30 tablet, Rfl: 5   fluticasone (FLONASE) 50 MCG/ACT nasal spray, Place 2 sprays into both nostrils daily., Disp: 16 g, Rfl: 0   methylPREDNISolone (MEDROL DOSEPAK) 4 MG TBPK tablet, As directed, Disp: 21 tablet, Rfl: 0   QUEtiapine Fumarate (SEROQUEL XR) 150 MG 24 hr tablet, Take 1 tablet (150 mg total) by mouth at bedtime., Disp: 90 tablet, Rfl: 1   Spacer/Aero-Holding Chambers DEVI, 1 Device by Does not apply  route daily as needed., Disp: 1 each, Rfl: 0   telmisartan (MICARDIS) 40 MG tablet, Take 1 tablet (40 mg total) by mouth daily., Disp: 90 tablet, Rfl: 1   traZODone (DESYREL) 100 MG tablet, Take 1 tablet (100 mg total) by mouth at bedtime., Disp: 90 tablet, Rfl: 1   Objective:     There were no vitals filed for this visit.    There is no height or weight on file to calculate BMI.    Physical Exam:    ***   Electronically signed by:  Aleen Sells D.[name] 7:28 AM 11/18/22

## 2022-11-18 NOTE — Telephone Encounter (Signed)
Patient informed of the message below.  Stated she has already been seen by her GYN and given the same medication.  States the savings card did not work due to her insurance nor GoodRx for the medication.  Message sent to PCP.

## 2022-11-18 NOTE — Telephone Encounter (Signed)
Patient informed of the message below.

## 2022-11-18 NOTE — Telephone Encounter (Signed)
I'm sorry I'm just not sure what else I can prescribe her, she might try herbal supplements like black cohosh. I still think GYN would be able to help her more since they are the specialist.

## 2022-11-19 ENCOUNTER — Ambulatory Visit: Payer: BC Managed Care – PPO | Admitting: Sports Medicine

## 2022-11-20 ENCOUNTER — Other Ambulatory Visit: Payer: Self-pay | Admitting: Family Medicine

## 2022-11-20 DIAGNOSIS — F411 Generalized anxiety disorder: Secondary | ICD-10-CM

## 2022-12-01 ENCOUNTER — Other Ambulatory Visit: Payer: Self-pay | Admitting: Family Medicine

## 2022-12-01 DIAGNOSIS — I10 Essential (primary) hypertension: Secondary | ICD-10-CM

## 2022-12-01 NOTE — Progress Notes (Unsigned)
    Paula Sherman D.[name] [address] [place] [address] Phone: [phone]   Assessment and Plan:     There are no diagnoses linked to this encounter.  ***   Pertinent previous records reviewed include ***   Follow Up: ***     Subjective:   I, Paula Sherman, am serving as a Neurosurgeon for Doctor Richardean Sale  Chief Complaint: neck pain   HPI:   12/02/2022 Patient is a 47 year old female complaining of neck pain. Patient states   Relevant Historical Information: ***  Additional pertinent review of systems negative.   Current Outpatient Medications:    albuterol (VENTOLIN HFA) 108 (90 Base) MCG/ACT inhaler, INHALE 2 PUFFS INTO LUNGS EVERY 6 HOURS AS NEEDED FOR WHEEZING FOR SHORTNESS OF BREATH, Disp: 9 g, Rfl: 0   amLODipine (NORVASC) 10 MG tablet, Take 10 mg by mouth at bedtime., Disp: , Rfl:    Ascorbic Acid (VITAMIN C PO), Take by mouth daily., Disp: , Rfl:    chlorthalidone (HYGROTON) 25 MG tablet, Take 1 tablet (25 mg total) by mouth daily., Disp: 90 tablet, Rfl: 1   citalopram (CELEXA) 40 MG tablet, Take 1 tablet (40 mg total) by mouth daily., Disp: 90 tablet, Rfl: 1   Cyanocobalamin (B-12 PO), Take by mouth daily., Disp: , Rfl:    diclofenac (VOLTAREN) 75 MG EC tablet, Take 1 tablet (75 mg total) by mouth 2 (two) times daily., Disp: 60 tablet, Rfl: 5   Ferrous Sulfate (IRON PO), Take by mouth daily., Disp: , Rfl:    fluticasone (FLONASE) 50 MCG/ACT nasal spray, Place 2 sprays into both nostrils daily., Disp: 16 g, Rfl: 0   methylPREDNISolone (MEDROL DOSEPAK) 4 MG TBPK tablet, As directed, Disp: 21 tablet, Rfl: 0   QUEtiapine Fumarate (SEROQUEL XR) 150 MG 24 hr tablet, Take 1 tablet (150 mg total) by mouth at bedtime., Disp: 90 tablet, Rfl: 1   Spacer/Aero-Holding Chambers DEVI, 1 Device by Does not apply route daily as needed., Disp: 1 each, Rfl: 0   telmisartan (MICARDIS) 40 MG tablet, Take 1 tablet by mouth once daily,  Disp: 90 tablet, Rfl: 0   traZODone (DESYREL) 100 MG tablet, Take 1 tablet (100 mg total) by mouth at bedtime., Disp: 90 tablet, Rfl: 1   Objective:     There were no vitals filed for this visit.    There is no height or weight on file to calculate BMI.    Physical Exam:    ***   Electronically signed by:  Paula Sherman [name] 3:31 PM 12/01/22

## 2022-12-02 ENCOUNTER — Ambulatory Visit (INDEPENDENT_AMBULATORY_CARE_PROVIDER_SITE_OTHER): Payer: BC Managed Care – PPO | Admitting: Sports Medicine

## 2022-12-02 VITALS — BP 108/78 | Ht 67.0 in | Wt 206.0 lb

## 2022-12-02 DIAGNOSIS — G8929 Other chronic pain: Secondary | ICD-10-CM | POA: Diagnosis not present

## 2022-12-02 DIAGNOSIS — M25511 Pain in right shoulder: Secondary | ICD-10-CM

## 2022-12-02 MED ORDER — MELOXICAM 15 MG PO TABS: 15.0000 mg | ORAL_TABLET | Freq: Every day | ORAL | 0 refills | Status: DC

## 2022-12-02 MED ORDER — CYCLOBENZAPRINE HCL 5 MG PO TABS: 5.0000 mg | ORAL_TABLET | Freq: Every day | ORAL | 0 refills | Status: DC

## 2022-12-02 NOTE — Patient Instructions (Addendum)
Good to see you Shoulder HEP  - Start meloxicam 15 mg daily x2 weeks.  If still having pain after 2 weeks, complete 3rd-week of meloxicam. May use remaining meloxicam as needed once daily for pain control.  Do not to use additional NSAIDs while taking meloxicam.  May use Tylenol [phone] mg 2 to 3 times a day for breakthrough pain. Flexeril 5-10 mg nightly as needed for muscle spasm

## 2022-12-10 ENCOUNTER — Other Ambulatory Visit: Payer: Self-pay | Admitting: *Deleted

## 2022-12-10 DIAGNOSIS — R7989 Other specified abnormal findings of blood chemistry: Secondary | ICD-10-CM

## 2022-12-10 DIAGNOSIS — D500 Iron deficiency anemia secondary to blood loss (chronic): Secondary | ICD-10-CM

## 2022-12-11 ENCOUNTER — Inpatient Hospital Stay: Payer: BC Managed Care – PPO | Attending: Hematology & Oncology

## 2022-12-11 DIAGNOSIS — D72819 Decreased white blood cell count, unspecified: Secondary | ICD-10-CM | POA: Insufficient documentation

## 2022-12-11 DIAGNOSIS — D500 Iron deficiency anemia secondary to blood loss (chronic): Secondary | ICD-10-CM

## 2022-12-11 DIAGNOSIS — R7989 Other specified abnormal findings of blood chemistry: Secondary | ICD-10-CM

## 2022-12-11 LAB — CMP (CANCER CENTER ONLY)
ALT: 19 U/L (ref 0–44)
AST: 21 U/L (ref 15–41)
Albumin: 4.2 g/dL (ref 3.5–5.0)
Alkaline Phosphatase: 72 U/L (ref 38–126)
Anion gap: 11 (ref 5–15)
BUN: 21 mg/dL — ABNORMAL HIGH (ref 6–20)
CO2: 32 mmol/L (ref 22–32)
Calcium: 10.1 mg/dL (ref 8.9–10.3)
Chloride: 100 mmol/L (ref 98–111)
Creatinine: 0.96 mg/dL (ref 0.44–1.00)
GFR, Estimated: >60 mL/min (ref >60)
Glucose, Bld: 91 mg/dL (ref 70–99)
Potassium: 3.5 mmol/L (ref 3.5–5.1)
Sodium: 143 mmol/L (ref 135–145)
Total Bilirubin: 0.6 mg/dL (ref 0.3–1.2)
Total Protein: 8.2 g/dL — ABNORMAL HIGH (ref 6.5–8.1)

## 2022-12-11 LAB — CBC WITH DIFFERENTIAL (CANCER CENTER ONLY)
Abs Immature Granulocytes: 0.01 10*3/uL (ref 0.00–0.07)
Basophils Absolute: 0 10*3/uL (ref 0.0–0.1)
Basophils Relative: 1 %
Eosinophils Absolute: 0.1 10*3/uL (ref 0.0–0.5)
Eosinophils Relative: 3 %
HCT: 38.6 % (ref 36.0–46.0)
Hemoglobin: 12.9 g/dL (ref 12.0–15.0)
Immature Granulocytes: 0 %
Lymphocytes Relative: 42 %
Lymphs Abs: 1.2 10*3/uL (ref 0.7–4.0)
MCH: 29.6 pg (ref 26.0–34.0)
MCHC: 33.4 g/dL (ref 30.0–36.0)
MCV: 88.5 fL (ref 80.0–100.0)
Monocytes Absolute: 0.4 10*3/uL (ref 0.1–1.0)
Monocytes Relative: 13 %
Neutro Abs: 1.2 10*3/uL — ABNORMAL LOW (ref 1.7–7.7)
Neutrophils Relative %: 41 %
Platelet Count: 244 10*3/uL (ref 150–400)
RBC: 4.36 MIL/uL (ref 3.87–5.11)
RDW: 12.2 % (ref 11.5–15.5)
WBC Count: 2.8 10*3/uL — ABNORMAL LOW (ref 4.0–10.5)
nRBC: 0 % (ref 0.0–0.2)

## 2022-12-11 LAB — SAVE SMEAR(SSMR), FOR PROVIDER SLIDE REVIEW

## 2022-12-15 ENCOUNTER — Telehealth: Payer: Self-pay | Admitting: Family Medicine

## 2022-12-15 NOTE — Telephone Encounter (Signed)
QUEtiapine Fumarate (SEROQUEL XR) 150 MG 24 hr tablet patient requesting refill says she her bottle says take 3 at night but prescription was written for 1 per day and she has run out.   [facility] - [place] ([place]), [place] - [address] Phone: [phone]  Fax: [phone]

## 2022-12-15 NOTE — Telephone Encounter (Signed)
she was only supposed to take 1 tablet of the 150 mg at night--- prior to that she was on 75 mg of seroquel before but this was changed --- please tell the patient that she is only supposed to be on 150 mg XR 1 tablet at bedtime--- not sure why the bottle says 3 tablets at night-- Can you call the pharmacy and find out what happened??

## 2022-12-15 NOTE — Telephone Encounter (Signed)
Pt called back to say, "Never mind, I got it."

## 2022-12-15 NOTE — Telephone Encounter (Signed)
Spoke with the patient and informed her of the message below.  Patient stated she thinks she has been taking 3 of the 75mg  tablets, was not aware of the 150mg  prescription.  Patient was informed the Rx was sent in on 4/24 for 150mg  and was advised to check the actual bottle she has at home and call back with more information to clarify in order for the message to be sent to Dr Casimiro Needle.

## 2022-12-24 NOTE — Progress Notes (Signed)
Paula Sherman D.[name] [address] [place] [address] Phone: [phone]   Assessment and Plan:     1. Chronic right shoulder pain 2. Subacromial bursitis of right shoulder joint - Chronic with exacerbation, subsequent visit - Still most consistent with rotator cuff pathology instead of cervical radiculopathy based on HPI and physical exam - Patient received essentially no relief from meloxicam course and has had no relief from prednisone course in the past.  Muscle relaxers would help patient's sleep, but did not provide relief. -Discontinue meloxicam - Will refill Flexeril for patient to use at night as needed for muscle spasms - Patient elected for subacromial CSI.  Tolerated well per note below. - Continue HEP and start physical therapy.  Referral sent  Procedure: Subacromial Injection Side: Right  Risks explained and consent was given verbally. The site was cleaned with alcohol prep. A steroid injection was performed from posterior approach using 2mL of 1% lidocaine without epinephrine and 1mL of kenalog 40mg /ml. This was well tolerated and resulted in symptomatic relief.  Needle was removed, hemostasis achieved, and post injection instructions were explained.   Pt was advised to call or return to clinic if these symptoms worsen or fail to improve as anticipated.    Pertinent previous records reviewed include none   Follow Up: 4 weeks for reevaluation   Subjective:   I, Paula Sherman, am serving as a Neurosurgeon for Doctor Richardean Sale   Chief Complaint: shoulder  pain    HPI:    12/02/2022 Patient is a 47 year old female complaining of shoulder  pain. Patient states that has had right shoulder pain for a couple of months, pain radiates down to her arm to her hand and sometimes into her neck  , no MOI, family hx of RA, does get numbness and tingling, decreased ROM due to pain, decreased grip strength, tylenol and ibu do not help  with the pain, pain is constant ,she is not able to sleep through the night    12/25/2022 Patient states that she is the same she ran out of flexeril and that was really helping her sleep     Relevant Historical Information: Hypertension,  Additional pertinent review of systems negative.   Current Outpatient Medications:    albuterol (VENTOLIN HFA) 108 (90 Base) MCG/ACT inhaler, INHALE 2 PUFFS INTO LUNGS EVERY 6 HOURS AS NEEDED FOR WHEEZING FOR SHORTNESS OF BREATH, Disp: 9 g, Rfl: 0   amLODipine (NORVASC) 10 MG tablet, Take 10 mg by mouth at bedtime., Disp: , Rfl:    Ascorbic Acid (VITAMIN C PO), Take by mouth daily., Disp: , Rfl:    chlorthalidone (HYGROTON) 25 MG tablet, Take 1 tablet (25 mg total) by mouth daily., Disp: 90 tablet, Rfl: 1   citalopram (CELEXA) 40 MG tablet, Take 1 tablet (40 mg total) by mouth daily., Disp: 90 tablet, Rfl: 1   Cyanocobalamin (B-12 PO), Take by mouth daily., Disp: , Rfl:    cyclobenzaprine (FLEXERIL) 5 MG tablet, Take 1 tablet (5 mg total) by mouth at bedtime., Disp: 30 tablet, Rfl: 0   diclofenac (VOLTAREN) 75 MG EC tablet, Take 1 tablet (75 mg total) by mouth 2 (two) times daily., Disp: 60 tablet, Rfl: 5   Ferrous Sulfate (IRON PO), Take by mouth daily., Disp: , Rfl:    fluticasone (FLONASE) 50 MCG/ACT nasal spray, Place 2 sprays into both nostrils daily., Disp: 16 g, Rfl: 0   meloxicam (MOBIC) 15 MG tablet, Take  1 tablet (15 mg total) by mouth daily., Disp: 30 tablet, Rfl: 0   methylPREDNISolone (MEDROL DOSEPAK) 4 MG TBPK tablet, As directed, Disp: 21 tablet, Rfl: 0   QUEtiapine Fumarate (SEROQUEL XR) 150 MG 24 hr tablet, Take 1 tablet (150 mg total) by mouth at bedtime., Disp: 90 tablet, Rfl: 1   Spacer/Aero-Holding Chambers DEVI, 1 Device by Does not apply route daily as needed., Disp: 1 each, Rfl: 0   telmisartan (MICARDIS) 40 MG tablet, Take 1 tablet by mouth once daily, Disp: 90 tablet, Rfl: 0   traZODone (DESYREL) 100 MG tablet, Take 1 tablet (100  mg total) by mouth at bedtime., Disp: 90 tablet, Rfl: 1   Objective:     Vitals:   12/25/22 0815  BP: 120/82  Weight: 206 lb (93.4 kg)  Height: 5\' 7"  (1.702 m)      Body mass index is 32.26 kg/m.    Physical Exam:    Gen: Appears well, nad, nontoxic and pleasant Neuro:sensation intact, strength is 5/5 with df/pf/inv/ev, muscle tone wnl Skin: no suspicious lesion or defmority Psych: A&O, appropriate mood and affect   Right shoulder:  No deformity, swelling or muscle wasting No scapular winging FF 180, abd 180, int 0, ext 90 with pain throughout ROM TTP AC, coracoid, biceps groove, humerus, deltoid, trapezius NTTP over the [place], clavicle,  cervical spine Pain positive special testing which included neer, hawkins, empty can, obriens, crossarm, subscap liftoff, speeds Neg ant drawer, sulcus sign, apprehension Negative Spurling's test bilat FROM of neck     Electronically signed by:  Paula Sherman [name] 8:42 AM 12/25/22

## 2022-12-25 ENCOUNTER — Ambulatory Visit (INDEPENDENT_AMBULATORY_CARE_PROVIDER_SITE_OTHER): Payer: BC Managed Care – PPO | Admitting: Sports Medicine

## 2022-12-25 VITALS — BP 120/82 | Ht 67.0 in | Wt 206.0 lb

## 2022-12-25 DIAGNOSIS — M7551 Bursitis of right shoulder: Secondary | ICD-10-CM

## 2022-12-25 DIAGNOSIS — G8929 Other chronic pain: Secondary | ICD-10-CM

## 2022-12-25 MED ORDER — CYCLOBENZAPRINE HCL 5 MG PO TABS: 5.0000 mg | ORAL_TABLET | Freq: Every day | ORAL | 0 refills | Status: DC

## 2022-12-25 NOTE — Patient Instructions (Signed)
Good to see you  Pt referral

## 2022-12-26 ENCOUNTER — Other Ambulatory Visit: Payer: Self-pay | Admitting: Sports Medicine

## 2022-12-29 ENCOUNTER — Telehealth: Payer: Self-pay | Admitting: Family Medicine

## 2022-12-29 DIAGNOSIS — F411 Generalized anxiety disorder: Secondary | ICD-10-CM

## 2022-12-29 DIAGNOSIS — F5104 Psychophysiologic insomnia: Secondary | ICD-10-CM

## 2022-12-29 MED ORDER — TRAZODONE HCL 100 MG PO TABS
100.0000 mg | ORAL_TABLET | Freq: Every day | ORAL | 1 refills | Status: DC
Start: 2022-12-29 — End: 2023-06-28

## 2022-12-29 NOTE — Telephone Encounter (Signed)
Refill sent.

## 2022-12-29 NOTE — Telephone Encounter (Signed)
Prescription Request  12/29/2022  LOV: 11/04/2022  What is the name of the medication or equipment? traZODone (DESYREL) 100 MG tablet   Have you contacted your pharmacy to request a refill? Yes   Which pharmacy would you like this sent to?  [facility] - [place] ([place]), [place] - [address] [place] ([place]) [place] [address] Phone: [phone] Fax: [phone]    Patient notified that their request is being sent to the clinical staff for review and that they should receive a response within 2 business days.   Please advise at Mobile [phone] (mobile)

## 2022-12-29 NOTE — Telephone Encounter (Signed)
Ok to refill 

## 2023-01-04 ENCOUNTER — Telehealth: Payer: Self-pay | Admitting: Sports Medicine

## 2023-01-04 NOTE — Telephone Encounter (Signed)
Patient called stating that she was under the impression that Dr Jean Rosenthal was going to send something in to help her sleep. We discussed the Flexeril but she said that she didn't know if that was it. She said that it was "two little tablets to help me sleep at night". Do you know if that could be the Flexeril or if there was something new?  Please advise.  (Patient would like a call back with an update when available)

## 2023-01-05 NOTE — Telephone Encounter (Signed)
Called and left VM for patient to call back.

## 2023-01-07 ENCOUNTER — Encounter (HOSPITAL_BASED_OUTPATIENT_CLINIC_OR_DEPARTMENT_OTHER): Payer: Self-pay | Admitting: Physical Therapy

## 2023-01-07 ENCOUNTER — Other Ambulatory Visit: Payer: Self-pay

## 2023-01-07 ENCOUNTER — Ambulatory Visit (HOSPITAL_BASED_OUTPATIENT_CLINIC_OR_DEPARTMENT_OTHER): Payer: BC Managed Care – PPO | Attending: Sports Medicine | Admitting: Physical Therapy

## 2023-01-07 DIAGNOSIS — M6281 Muscle weakness (generalized): Secondary | ICD-10-CM | POA: Diagnosis not present

## 2023-01-07 DIAGNOSIS — M25611 Stiffness of right shoulder, not elsewhere classified: Secondary | ICD-10-CM | POA: Insufficient documentation

## 2023-01-07 DIAGNOSIS — G8929 Other chronic pain: Secondary | ICD-10-CM | POA: Diagnosis not present

## 2023-01-07 DIAGNOSIS — M25511 Pain in right shoulder: Secondary | ICD-10-CM | POA: Insufficient documentation

## 2023-01-07 DIAGNOSIS — M7551 Bursitis of right shoulder: Secondary | ICD-10-CM | POA: Diagnosis not present

## 2023-01-07 MED ORDER — CYCLOBENZAPRINE HCL 5 MG PO TABS: 5.0000 mg | ORAL_TABLET | Freq: Every day | ORAL | 0 refills | Status: DC

## 2023-01-07 NOTE — Telephone Encounter (Signed)
Patient would like for this to be sent in. Would you be able to do this for me?  Pharmacy: [facility]

## 2023-01-07 NOTE — Telephone Encounter (Signed)
Rx filled per a verbal from Dr. Smith.  
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
## 2023-01-11 ENCOUNTER — Ambulatory Visit (HOSPITAL_BASED_OUTPATIENT_CLINIC_OR_DEPARTMENT_OTHER): Payer: BC Managed Care – PPO | Attending: Sports Medicine

## 2023-01-11 DIAGNOSIS — M25611 Stiffness of right shoulder, not elsewhere classified: Secondary | ICD-10-CM | POA: Insufficient documentation

## 2023-01-11 DIAGNOSIS — M7551 Bursitis of right shoulder: Secondary | ICD-10-CM | POA: Insufficient documentation

## 2023-01-11 DIAGNOSIS — G8929 Other chronic pain: Secondary | ICD-10-CM | POA: Insufficient documentation

## 2023-01-11 DIAGNOSIS — M25511 Pain in right shoulder: Secondary | ICD-10-CM | POA: Insufficient documentation

## 2023-01-11 DIAGNOSIS — M6281 Muscle weakness (generalized): Secondary | ICD-10-CM | POA: Insufficient documentation

## 2023-01-21 ENCOUNTER — Ambulatory Visit (HOSPITAL_BASED_OUTPATIENT_CLINIC_OR_DEPARTMENT_OTHER): Payer: BC Managed Care – PPO | Admitting: Physical Therapy

## 2023-01-21 ENCOUNTER — Encounter (HOSPITAL_BASED_OUTPATIENT_CLINIC_OR_DEPARTMENT_OTHER): Payer: Self-pay | Admitting: Physical Therapy

## 2023-01-21 NOTE — Progress Notes (Deleted)
    Paula Sherman D.[facility] [address] [place] [address] Phone: [phone]   Assessment and Plan:     There are no diagnoses linked to this encounter.  ***   Pertinent previous records reviewed include ***   Follow Up: ***     Subjective:   I, Paula Sherman, am serving as a Neurosurgeon for Paula Sherman   Chief Complaint: shoulder  pain    HPI:    12/02/2022 Patient is a 47 year old female complaining of shoulder  pain. Patient states that has had right shoulder pain for a couple of months, pain radiates down to her arm to her hand and sometimes into her neck  , no MOI, family hx of RA, does get numbness and tingling, decreased ROM due to pain, decreased grip strength, tylenol and ibu do not help with the pain, pain is constant ,she is not able to sleep through the night    12/25/2022 Patient states that she is the same she ran out of flexeril and that was really helping her sleep   01/22/2023 Patient states      Relevant Historical Information: Hypertension,  Additional pertinent review of systems negative.   Current Outpatient Medications:    albuterol (VENTOLIN HFA) 108 (90 Base) MCG/ACT inhaler, INHALE 2 PUFFS INTO LUNGS EVERY 6 HOURS AS NEEDED FOR WHEEZING FOR SHORTNESS OF BREATH, Disp: 9 g, Rfl: 0   amLODipine (NORVASC) 10 MG tablet, Take 10 mg by mouth at bedtime., Disp: , Rfl:    Ascorbic Acid (VITAMIN C PO), Take by mouth daily., Disp: , Rfl:    chlorthalidone (HYGROTON) 25 MG tablet, Take 1 tablet (25 mg total) by mouth daily., Disp: 90 tablet, Rfl: 1   citalopram (CELEXA) 40 MG tablet, Take 1 tablet (40 mg total) by mouth daily., Disp: 90 tablet, Rfl: 1   Cyanocobalamin (B-12 PO), Take by mouth daily., Disp: , Rfl:    cyclobenzaprine (FLEXERIL) 5 MG tablet, Take 1 tablet (5 mg total) by mouth at bedtime., Disp: 30 tablet, Rfl: 0   diclofenac (VOLTAREN) 75 MG EC tablet, Take 1 tablet (75 mg total) by mouth 2  (two) times daily., Disp: 60 tablet, Rfl: 5   Ferrous Sulfate (IRON PO), Take by mouth daily., Disp: , Rfl:    fluticasone (FLONASE) 50 MCG/ACT nasal spray, Place 2 sprays into both nostrils daily., Disp: 16 g, Rfl: 0   methylPREDNISolone (MEDROL DOSEPAK) 4 MG TBPK tablet, As directed, Disp: 21 tablet, Rfl: 0   QUEtiapine Fumarate (SEROQUEL XR) 150 MG 24 hr tablet, Take 1 tablet (150 mg total) by mouth at bedtime., Disp: 90 tablet, Rfl: 1   Spacer/Aero-Holding Chambers DEVI, 1 Device by Does not apply route daily as needed., Disp: 1 each, Rfl: 0   telmisartan (MICARDIS) 40 MG tablet, Take 1 tablet by mouth once daily, Disp: 90 tablet, Rfl: 0   traZODone (DESYREL) 100 MG tablet, Take 1 tablet (100 mg total) by mouth at bedtime., Disp: 90 tablet, Rfl: 1   Objective:     There were no vitals filed for this visit.    There is no height or weight on file to calculate BMI.    Physical Exam:    ***   Electronically signed by:  Paula Sherman D.[facility] 7:23 AM 01/21/23

## 2023-01-22 ENCOUNTER — Ambulatory Visit: Payer: BC Managed Care – PPO | Admitting: Sports Medicine

## 2023-01-22 NOTE — Progress Notes (Deleted)
    Paula Sherman D.[name] [address] [place] [address] Phone: [phone]   Assessment and Plan:     There are no diagnoses linked to this encounter.  ***   Pertinent previous records reviewed include ***   Follow Up: ***     Subjective:   I, Paula Sherman, am serving as a Neurosurgeon for Doctor Richardean Sale   Chief Complaint: shoulder  pain    HPI:    12/02/2022 Patient is a 47 year old female complaining of shoulder  pain. Patient states that has had right shoulder pain for a couple of months, pain radiates down to her arm to her hand and sometimes into her neck  , no MOI, family hx of RA, does get numbness and tingling, decreased ROM due to pain, decreased grip strength, tylenol and ibu do not help with the pain, pain is constant ,she is not able to sleep through the night    12/25/2022 Patient states that she is the same she ran out of flexeril and that was really helping her sleep    01/25/2023 Patient states    Relevant Historical Information: Hypertension,  Additional pertinent review of systems negative.   Current Outpatient Medications:    albuterol (VENTOLIN HFA) 108 (90 Base) MCG/ACT inhaler, INHALE 2 PUFFS INTO LUNGS EVERY 6 HOURS AS NEEDED FOR WHEEZING FOR SHORTNESS OF BREATH, Disp: 9 g, Rfl: 0   amLODipine (NORVASC) 10 MG tablet, Take 10 mg by mouth at bedtime., Disp: , Rfl:    Ascorbic Acid (VITAMIN C PO), Take by mouth daily., Disp: , Rfl:    chlorthalidone (HYGROTON) 25 MG tablet, Take 1 tablet (25 mg total) by mouth daily., Disp: 90 tablet, Rfl: 1   citalopram (CELEXA) 40 MG tablet, Take 1 tablet (40 mg total) by mouth daily., Disp: 90 tablet, Rfl: 1   Cyanocobalamin (B-12 PO), Take by mouth daily., Disp: , Rfl:    cyclobenzaprine (FLEXERIL) 5 MG tablet, Take 1 tablet (5 mg total) by mouth at bedtime., Disp: 30 tablet, Rfl: 0   diclofenac (VOLTAREN) 75 MG EC tablet, Take 1 tablet (75 mg total) by mouth 2  (two) times daily., Disp: 60 tablet, Rfl: 5   Ferrous Sulfate (IRON PO), Take by mouth daily., Disp: , Rfl:    fluticasone (FLONASE) 50 MCG/ACT nasal spray, Place 2 sprays into both nostrils daily., Disp: 16 g, Rfl: 0   methylPREDNISolone (MEDROL DOSEPAK) 4 MG TBPK tablet, As directed, Disp: 21 tablet, Rfl: 0   QUEtiapine Fumarate (SEROQUEL XR) 150 MG 24 hr tablet, Take 1 tablet (150 mg total) by mouth at bedtime., Disp: 90 tablet, Rfl: 1   Spacer/Aero-Holding Chambers DEVI, 1 Device by Does not apply route daily as needed., Disp: 1 each, Rfl: 0   telmisartan (MICARDIS) 40 MG tablet, Take 1 tablet by mouth once daily, Disp: 90 tablet, Rfl: 0   traZODone (DESYREL) 100 MG tablet, Take 1 tablet (100 mg total) by mouth at bedtime., Disp: 90 tablet, Rfl: 1   Objective:     There were no vitals filed for this visit.    There is no height or weight on file to calculate BMI.    Physical Exam:    ***   Electronically signed by:  Paula Sherman D.[name] 7:23 AM 01/22/23

## 2023-01-25 ENCOUNTER — Ambulatory Visit: Payer: BC Managed Care – PPO | Admitting: Sports Medicine

## 2023-01-26 ENCOUNTER — Ambulatory Visit (HOSPITAL_BASED_OUTPATIENT_CLINIC_OR_DEPARTMENT_OTHER): Payer: BC Managed Care – PPO

## 2023-01-26 ENCOUNTER — Telehealth (HOSPITAL_BASED_OUTPATIENT_CLINIC_OR_DEPARTMENT_OTHER): Payer: Self-pay

## 2023-01-26 NOTE — Telephone Encounter (Signed)
Called and spoke with pt regarding missed PT appt. She states she will be able to make next weeks appointments. Wanted to cancel appt this Thursday as well.

## 2023-01-28 ENCOUNTER — Encounter (HOSPITAL_BASED_OUTPATIENT_CLINIC_OR_DEPARTMENT_OTHER): Payer: BC Managed Care – PPO | Admitting: Physical Therapy

## 2023-02-02 ENCOUNTER — Ambulatory Visit (HOSPITAL_BASED_OUTPATIENT_CLINIC_OR_DEPARTMENT_OTHER): Payer: BC Managed Care – PPO

## 2023-02-03 ENCOUNTER — Ambulatory Visit: Payer: BC Managed Care – PPO | Admitting: Family Medicine

## 2023-02-04 ENCOUNTER — Encounter (HOSPITAL_BASED_OUTPATIENT_CLINIC_OR_DEPARTMENT_OTHER): Payer: Self-pay

## 2023-02-04 ENCOUNTER — Ambulatory Visit (HOSPITAL_BASED_OUTPATIENT_CLINIC_OR_DEPARTMENT_OTHER): Payer: BC Managed Care – PPO

## 2023-02-09 ENCOUNTER — Ambulatory Visit (HOSPITAL_BASED_OUTPATIENT_CLINIC_OR_DEPARTMENT_OTHER): Payer: BC Managed Care – PPO | Admitting: Physical Therapy

## 2023-03-05 ENCOUNTER — Other Ambulatory Visit: Payer: BC Managed Care – PPO

## 2023-03-05 ENCOUNTER — Ambulatory Visit: Payer: BC Managed Care – PPO | Admitting: Hematology & Oncology

## 2023-03-10 ENCOUNTER — Other Ambulatory Visit: Payer: Self-pay | Admitting: Family Medicine

## 2023-03-10 ENCOUNTER — Other Ambulatory Visit: Payer: Self-pay | Admitting: Sports Medicine

## 2023-03-10 DIAGNOSIS — I10 Essential (primary) hypertension: Secondary | ICD-10-CM

## 2023-03-19 ENCOUNTER — Inpatient Hospital Stay: Payer: BC Managed Care – PPO

## 2023-03-19 ENCOUNTER — Ambulatory Visit: Payer: BC Managed Care – PPO | Admitting: Hematology & Oncology

## 2023-03-26 ENCOUNTER — Encounter: Payer: Self-pay | Admitting: Hematology & Oncology

## 2023-03-28 ENCOUNTER — Other Ambulatory Visit: Payer: Self-pay | Admitting: Family Medicine

## 2023-03-28 DIAGNOSIS — I10 Essential (primary) hypertension: Secondary | ICD-10-CM

## 2023-03-31 ENCOUNTER — Ambulatory Visit: Payer: BC Managed Care – PPO | Admitting: Family Medicine

## 2023-04-01 ENCOUNTER — Telehealth: Payer: Self-pay | Admitting: Family Medicine

## 2023-04-01 DIAGNOSIS — F411 Generalized anxiety disorder: Secondary | ICD-10-CM

## 2023-04-01 DIAGNOSIS — M545 Low back pain, unspecified: Secondary | ICD-10-CM

## 2023-04-01 MED ORDER — CITALOPRAM HYDROBROMIDE 40 MG PO TABS: 40.0000 mg | ORAL_TABLET | Freq: Every day | ORAL | 1 refills | Status: DC

## 2023-04-01 MED ORDER — DICLOFENAC SODIUM 75 MG PO TBEC: 75.0000 mg | DELAYED_RELEASE_TABLET | Freq: Two times a day (BID) | ORAL | 5 refills | Status: DC

## 2023-04-01 MED ORDER — QUETIAPINE FUMARATE ER 150 MG PO TB24: 150.0000 mg | ORAL_TABLET | Freq: Every day | ORAL | 1 refills | Status: DC

## 2023-04-01 NOTE — Telephone Encounter (Signed)
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    Pt needs a med check and since MD does not have any availability until 05/17/23, Pt is asking if she can see another provider?  Please advise.

## 2023-04-01 NOTE — Telephone Encounter (Signed)
She can wait until November if she wants, I sent in refills for her that she needed.

## 2023-04-01 NOTE — Telephone Encounter (Signed)
Spoke with the patient, informed her of the message below and scheduled an appt on 11/5.

## 2023-04-09 ENCOUNTER — Inpatient Hospital Stay: Payer: BC Managed Care – PPO | Admitting: Hematology & Oncology

## 2023-04-09 ENCOUNTER — Inpatient Hospital Stay: Payer: BC Managed Care – PPO | Attending: Hematology & Oncology

## 2023-04-09 ENCOUNTER — Encounter: Payer: Self-pay | Admitting: Hematology & Oncology

## 2023-05-18 ENCOUNTER — Ambulatory Visit: Payer: BC Managed Care – PPO | Admitting: Family Medicine

## 2023-05-25 ENCOUNTER — Encounter: Payer: Self-pay | Admitting: Family Medicine

## 2023-05-25 ENCOUNTER — Telehealth: Payer: Self-pay | Admitting: Family Medicine

## 2023-05-25 ENCOUNTER — Ambulatory Visit (INDEPENDENT_AMBULATORY_CARE_PROVIDER_SITE_OTHER): Payer: BC Managed Care – PPO | Admitting: Family Medicine

## 2023-05-25 VITALS — BP 130/76 | HR 94 | Temp 98.0°F | Ht 67.0 in | Wt 218.1 lb

## 2023-05-25 DIAGNOSIS — R635 Abnormal weight gain: Secondary | ICD-10-CM

## 2023-05-25 DIAGNOSIS — F5104 Psychophysiologic insomnia: Secondary | ICD-10-CM | POA: Diagnosis not present

## 2023-05-25 DIAGNOSIS — M1811 Unilateral primary osteoarthritis of first carpometacarpal joint, right hand: Secondary | ICD-10-CM | POA: Diagnosis not present

## 2023-05-25 NOTE — Telephone Encounter (Signed)
Prescription Request  05/25/2023  LOV: 11/04/2022  What is the name of the medication or equipment? Quetiapine Fumarate. Pt states she was seen today and wanted to make sure her medication was being called in.   Have you contacted your pharmacy to request a refill? Yes   Which pharmacy would you like this sent to?  [id number] - [place] ([place]), [place] - [address] [place] ([place]) [place] [address] Phone: [phone] Fax: [phone]    Patient notified that their request is being sent to the clinical staff for review and that they should receive a response within 2 business days.   Please advise at Mobile [phone] (mobile)

## 2023-05-25 NOTE — Patient Instructions (Addendum)
Consider topical Voltaren/Diclofenac gel to right thumb 3-4 times daily.   Try to eliminate regular sodas.   Consider calorie tracking app such as MyFitnessPal.    Try to avoid caffeine after 1- 2 pm each day.

## 2023-05-25 NOTE — Telephone Encounter (Signed)
Patient informed that rx was sent in for 6 month supply on 04/01/2023 and she should check with her pharmacy for refills.
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
## 2023-06-26 ENCOUNTER — Other Ambulatory Visit: Payer: Self-pay | Admitting: Family Medicine

## 2023-06-26 DIAGNOSIS — I10 Essential (primary) hypertension: Secondary | ICD-10-CM

## 2023-06-26 DIAGNOSIS — F5104 Psychophysiologic insomnia: Secondary | ICD-10-CM

## 2023-06-26 DIAGNOSIS — F411 Generalized anxiety disorder: Secondary | ICD-10-CM

## 2023-08-11 ENCOUNTER — Ambulatory Visit (INDEPENDENT_AMBULATORY_CARE_PROVIDER_SITE_OTHER): Payer: BC Managed Care – PPO | Admitting: Family Medicine

## 2023-08-11 ENCOUNTER — Encounter: Payer: Self-pay | Admitting: Family Medicine

## 2023-08-11 VITALS — BP 120/72 | HR 85 | Temp 97.8°F | Ht 67.0 in | Wt 218.5 lb

## 2023-08-11 DIAGNOSIS — I10 Essential (primary) hypertension: Secondary | ICD-10-CM

## 2023-08-11 DIAGNOSIS — N951 Menopausal and female climacteric states: Secondary | ICD-10-CM

## 2023-08-11 MED ORDER — ESTRADIOL-LEVONORGESTREL 0.045-0.015 MG/DAY TD PTWK: 1.0000 | MEDICATED_PATCH | TRANSDERMAL | 11 refills | Status: DC

## 2023-08-11 MED ORDER — TELMISARTAN 40 MG PO TABS: 40.0000 mg | ORAL_TABLET | Freq: Every day | ORAL | 1 refills | Status: DC

## 2023-08-11 NOTE — Progress Notes (Signed)
Established Patient Office Visit  Subjective   Patient ID: Paula Sherman, female    DOB: 05-17-1976  Age: 48 y.o. MRN: [id number]  Chief Complaint  Patient presents with   Night Sweats    Recurent x3 weeks   Weight Check    Patient states she has gained weight, has stopped consumption of sodas    Pt is having irregular periods, she reports that since she stopped the hormone therapy she started having recurrent hot flashes and night sweats, is still taking her citalopram, trazodone, and quetiapine. We discussed using the patches and she is agreeable.  HTN -- BP in office performed and is well controlled. She  reports no side effects to the medications, no chest pain, SOB, dizziness or headaches. She has a BP cuff at home and is checking BP regularly, reports they are in the normal range.         Current Outpatient Medications  Medication Instructions   albuterol (VENTOLIN HFA) 108 (90 Base) MCG/ACT inhaler INHALE 2 PUFFS INTO LUNGS EVERY 6 HOURS AS NEEDED FOR WHEEZING FOR SHORTNESS OF BREATH   amLODipine (NORVASC) 10 mg, Oral, Daily at bedtime   Ascorbic Acid (VITAMIN C PO) Daily   chlorthalidone (HYGROTON) 25 mg, Oral, Daily   citalopram (CELEXA) 40 mg, Oral, Daily   Cyanocobalamin (B-12 PO) Daily   cyclobenzaprine (FLEXERIL) 5 mg, Oral, Daily at bedtime   diclofenac (VOLTAREN) 75 mg, Oral, 2 times daily   estradiol-levonorgestrel (CLIMARAPRO) 0.045-0.015 MG/DAY 1 patch, Transdermal, Weekly   Ferrous Sulfate (IRON PO) Daily   fluticasone (FLONASE) 50 MCG/ACT nasal spray 2 sprays, Each Nare, Daily   QUEtiapine Fumarate (SEROQUEL XR) 150 mg, Oral, Daily at bedtime   Spacer/Aero-Holding Chambers DEVI 1 Device, Does not apply, Daily PRN   telmisartan (MICARDIS) 40 mg, Oral, Daily   traZODone (DESYREL) 100 mg, Oral, Daily at bedtime      Review of Systems  All other systems reviewed and are negative.     Objective:     BP 120/72   Pulse 85   Temp 97.8  F (36.6 C) (Oral)   Ht 5\' 7"  (1.702 m)   Wt 218 lb 8 oz (99.1 kg)   LMP 07/02/2023 (Exact Date)   SpO2 98%   BMI 34.22 kg/m    Physical Exam Vitals reviewed.  Constitutional:      Appearance: Normal appearance. She is overweight.  Pulmonary:     Effort: Pulmonary effort is normal.  Musculoskeletal:     Right lower leg: No edema.     Left lower leg: No edema.  Skin:    General: Skin is warm and dry.  Neurological:     Mental Status: She is alert and oriented to person, place, and time. Mental status is at baseline.  Psychiatric:        Mood and Affect: Mood normal.        Behavior: Behavior normal.        Judgment: Judgment normal.      No results found for any visits on 08/11/23.    The 10-year ASCVD risk score (Arnett DK, et al., 2019) is: 2.9%    Assessment & Plan:  Menopausal flushing Assessment & Plan: Chronic, recurrent, pt had stopped her hormone replacement and the hot flashes returned. Will start the climara pro patches once weekly to help with her symptoms. RTC in 6 months for her annual physical and labs.   Orders: -     Estradiol-Levonorgestrel; Place 1 patch  onto the skin once a week.  Dispense: 4 patch; Refill: 11  Primary hypertension Assessment & Plan: Current hypertension medications:       Sig   amLODipine (NORVASC) 10 MG tablet (Taking) TAKE 1 TABLET BY MOUTH AT BEDTIME   chlorthalidone (HYGROTON) 25 MG tablet (Taking) Take 1 tablet by mouth once daily   telmisartan (MICARDIS) 40 MG tablet Take 1 tablet (40 mg total) by mouth daily.      Chronic, stable, well controlled, will continue the above medications as prescribed. Refills sent  Orders: -     Telmisartan; Take 1 tablet (40 mg total) by mouth daily.  Dispense: 90 tablet; Refill: 1     Return for annual physical exam.    Karie Georges, MD

## 2023-08-11 NOTE — Assessment & Plan Note (Signed)
Current hypertension medications:       Sig   amLODipine (NORVASC) 10 MG tablet (Taking) TAKE 1 TABLET BY MOUTH AT BEDTIME   chlorthalidone (HYGROTON) 25 MG tablet (Taking) Take 1 tablet by mouth once daily   telmisartan (MICARDIS) 40 MG tablet Take 1 tablet (40 mg total) by mouth daily.      Chronic, stable, well controlled, will continue the above medications as prescribed. Refills sent

## 2023-08-11 NOTE — Assessment & Plan Note (Signed)
Chronic, recurrent, pt had stopped her hormone replacement and the hot flashes returned. Will start the climara pro patches once weekly to help with her symptoms. RTC in 6 months for her annual physical and labs.

## 2023-08-30 ENCOUNTER — Telehealth: Payer: Self-pay

## 2023-08-30 DIAGNOSIS — N951 Menopausal and female climacteric states: Secondary | ICD-10-CM

## 2023-08-30 NOTE — Telephone Encounter (Signed)
 Copied from CRM [id number]. Topic: Clinical - Prescription Issue >> Aug 30, 2023  2:29 PM Paula Sherman wrote: Reason for CRM: Patient states she was prescribed Sherman medication for her hot flashes (does not know the name) and its to expensive, requesting alternative. Patient requesting Sherman call back for an update.

## 2023-08-31 MED ORDER — ESTROGENS CONJUGATED 0.625 MG PO TABS: 0.6250 mg | ORAL_TABLET | Freq: Every day | ORAL | 1 refills | Status: AC

## 2023-08-31 MED ORDER — PROGESTERONE MICRONIZED 100 MG PO CAPS: 100.0000 mg | ORAL_CAPSULE | Freq: Every day | ORAL | 1 refills | Status: AC

## 2023-08-31 NOTE — Addendum Note (Signed)
 Addended by: Karie Georges on: 08/31/2023 11:24 AM   Modules accepted: Orders

## 2023-08-31 NOTE — Telephone Encounter (Signed)
 We would have to go back to the pill form of estrogen and progesterone then, would she be willing to take the pill form of the medication?

## 2023-08-31 NOTE — Telephone Encounter (Signed)
 Spoke with the patient and informed her of the message below.  Message sent to PCP as the patient agreed to begin the oral form and requested the Rx be sent to [facility].

## 2023-08-31 NOTE — Telephone Encounter (Signed)
 Scripts sent

## 2023-09-02 ENCOUNTER — Telehealth: Payer: Self-pay

## 2023-09-02 NOTE — Telephone Encounter (Signed)
 Pharmacy Patient Advocate Encounter   Received notification from Onbase that prior authorization for Premarin 0.625MG  tablets is required/requested.   Insurance verification completed.   The patient is insured through [facility] .   Per test claim: PA required; PA submitted to above mentioned insurance via CoverMyMeds Key/confirmation #/EOC [facility] Status is pending

## 2023-09-07 NOTE — Telephone Encounter (Signed)
 Pharmacy Patient Advocate Encounter  Received notification from [facility] that Prior Authorization for Premarin has been DENIED.  Full denial letter will be uploaded to the media tab. See denial reason below.    PA #/Case ID/Reference #: No PA number given, please see attached denial letter in media tab

## 2023-09-07 NOTE — Telephone Encounter (Signed)
 Message sent to PA team to review office note from 03/27/2022 from PCP.

## 2023-10-02 ENCOUNTER — Other Ambulatory Visit: Payer: Self-pay | Admitting: Family Medicine

## 2023-10-02 DIAGNOSIS — F5104 Psychophysiologic insomnia: Secondary | ICD-10-CM

## 2023-10-02 DIAGNOSIS — F411 Generalized anxiety disorder: Secondary | ICD-10-CM

## 2023-10-04 NOTE — Telephone Encounter (Signed)
 NEW KEY: [id number]

## 2023-10-06 ENCOUNTER — Other Ambulatory Visit (HOSPITAL_COMMUNITY): Payer: Self-pay

## 2023-10-06 ENCOUNTER — Encounter: Payer: Self-pay | Admitting: Hematology & Oncology

## 2023-10-06 NOTE — Telephone Encounter (Signed)
 Spoke with Dois Davenport at [place] and informed her of the approval as below.  Dois Davenport stated the price will be $81.81 for a 30-day supply with the patient's insurance, cash price [phone] and ready for pick up on 3/27 after 4:30pm.  I called the patient and informed her of this.

## 2023-10-06 NOTE — Telephone Encounter (Signed)
 Pharmacy Patient Advocate Encounter  Received notification from [employer]  that Prior Authorization for Premarin 0.625MG  tablets has been APPROVED from 10/04/23 to 10/03/24   PA #/Case ID/Reference #: [id number]

## 2023-10-19 ENCOUNTER — Other Ambulatory Visit: Payer: Self-pay | Admitting: Sports Medicine

## 2023-11-06 ENCOUNTER — Other Ambulatory Visit: Payer: Self-pay | Admitting: Family Medicine

## 2023-11-12 ENCOUNTER — Other Ambulatory Visit: Payer: Self-pay | Admitting: Family Medicine

## 2023-11-12 DIAGNOSIS — I10 Essential (primary) hypertension: Secondary | ICD-10-CM

## 2023-12-04 ENCOUNTER — Other Ambulatory Visit: Payer: Self-pay | Admitting: Family Medicine

## 2023-12-04 DIAGNOSIS — F411 Generalized anxiety disorder: Secondary | ICD-10-CM

## 2023-12-09 ENCOUNTER — Other Ambulatory Visit: Payer: Self-pay | Admitting: Family Medicine

## 2023-12-09 DIAGNOSIS — F411 Generalized anxiety disorder: Secondary | ICD-10-CM

## 2023-12-09 DIAGNOSIS — I10 Essential (primary) hypertension: Secondary | ICD-10-CM

## 2023-12-09 DIAGNOSIS — M545 Low back pain, unspecified: Secondary | ICD-10-CM

## 2024-01-03 ENCOUNTER — Other Ambulatory Visit: Payer: Self-pay | Admitting: Family Medicine

## 2024-01-03 DIAGNOSIS — F411 Generalized anxiety disorder: Secondary | ICD-10-CM

## 2024-01-03 DIAGNOSIS — F5104 Psychophysiologic insomnia: Secondary | ICD-10-CM

## 2024-02-01 ENCOUNTER — Other Ambulatory Visit: Payer: Self-pay | Admitting: Family Medicine

## 2024-02-07 ENCOUNTER — Telehealth: Payer: Self-pay | Admitting: *Deleted

## 2024-02-07 DIAGNOSIS — F411 Generalized anxiety disorder: Secondary | ICD-10-CM

## 2024-02-07 MED ORDER — CITALOPRAM HYDROBROMIDE 40 MG PO TABS: 40.0000 mg | ORAL_TABLET | Freq: Every day | ORAL | 0 refills | Status: DC

## 2024-02-07 NOTE — Telephone Encounter (Signed)
 Rx done.

## 2024-02-16 IMAGING — MG MM DIGITAL SCREENING BILAT W/ TOMO AND CAD
8 series · 9 of 24 positions shown · non-contrast
Comparison: None available.

CLINICAL DATA: Screening.

EXAM:
DIGITAL SCREENING BILATERAL MAMMOGRAM WITH TOMOSYNTHESIS AND CAD
TECHNIQUE: Bilateral screening digital craniocaudal and mediolateral oblique
mammograms were obtained. Bilateral screening digital breast
tomosynthesis was performed. The images were evaluated with
computer-aided detection.

[L MLO synth-2D]
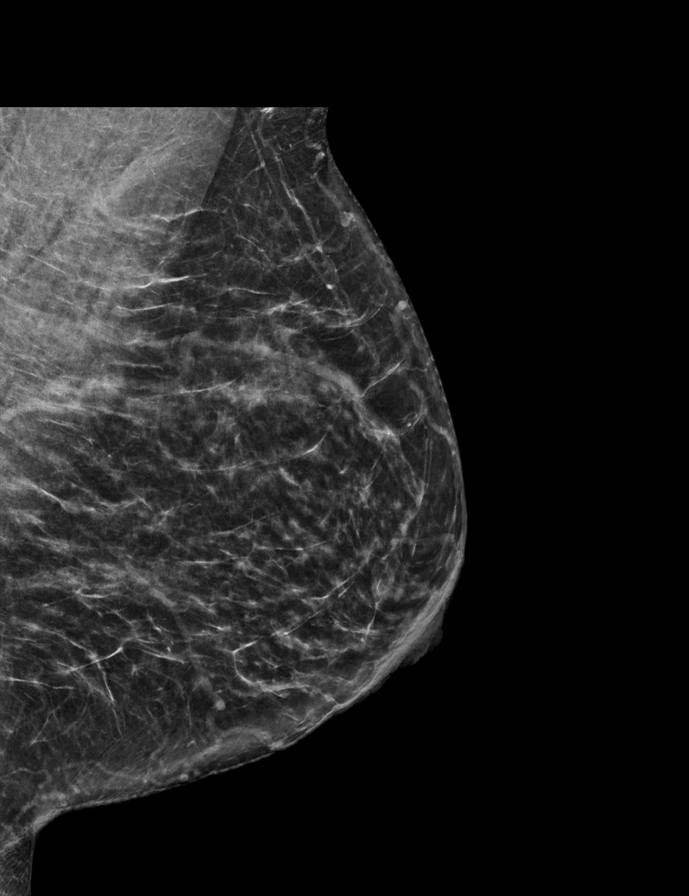

[L CC synth-2D]
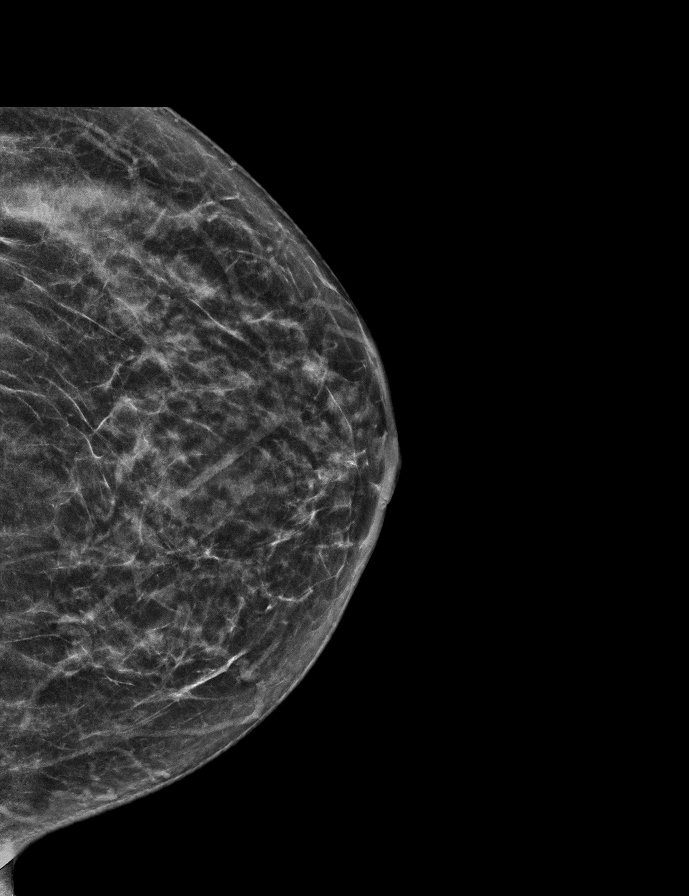

[R MLO synth-2D]
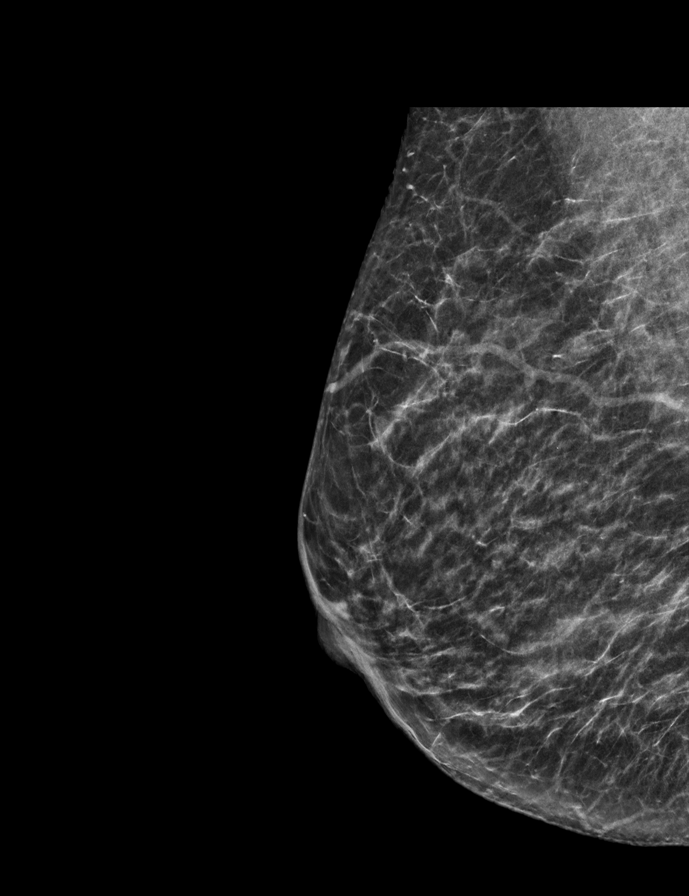

[R CC synth-2D]
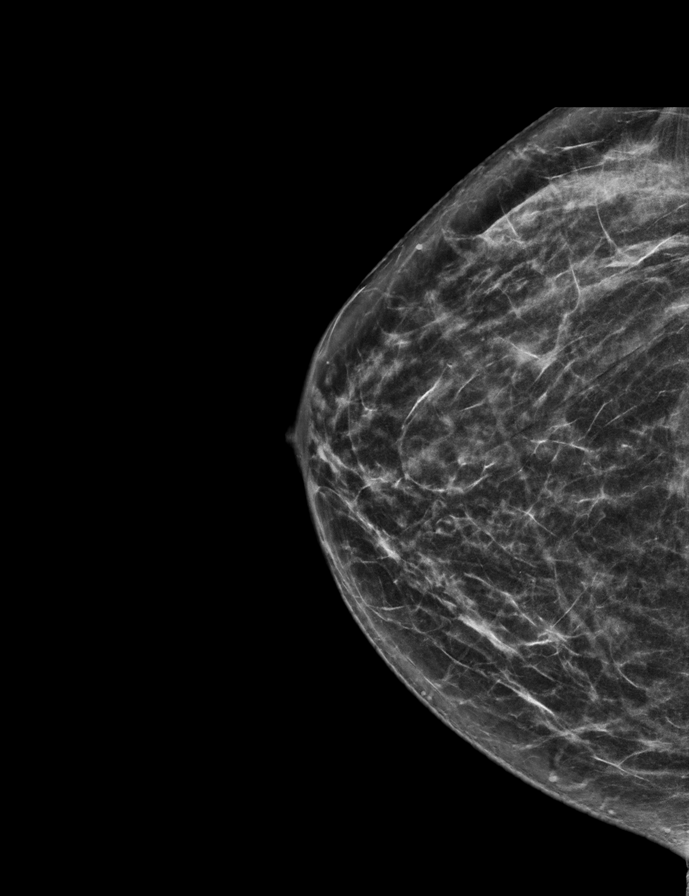

[L MLO tomo · 2 of 59 frames shown]
[frame 20/59]
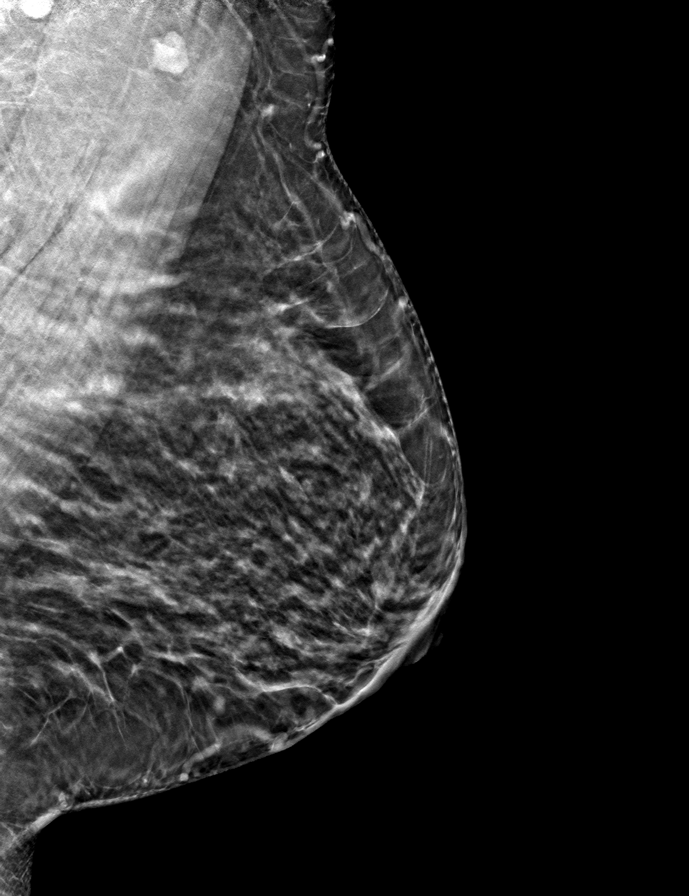
[frame 30/59]
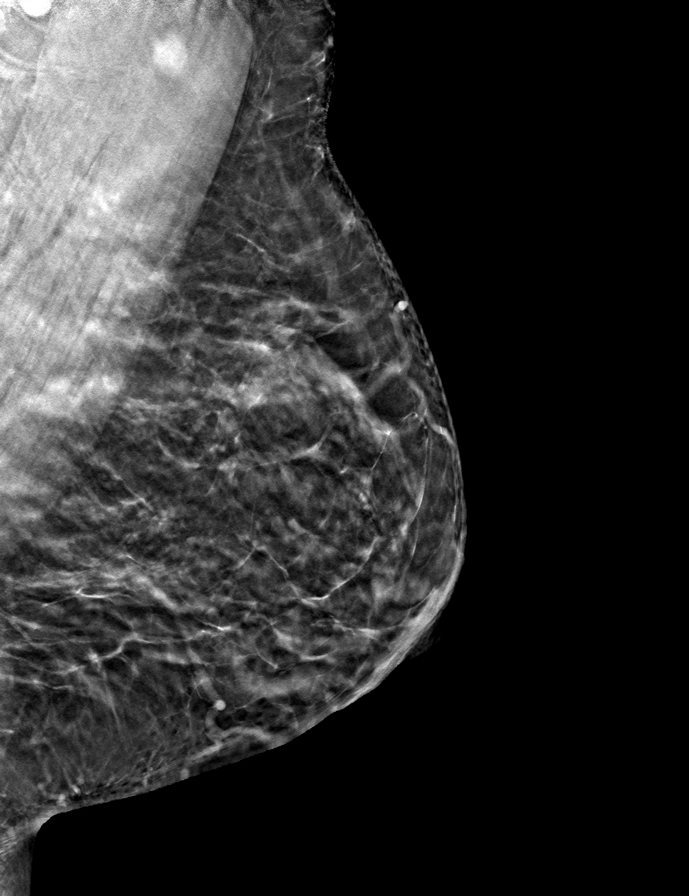

[R MLO tomo · tomo slice 29/56.0]
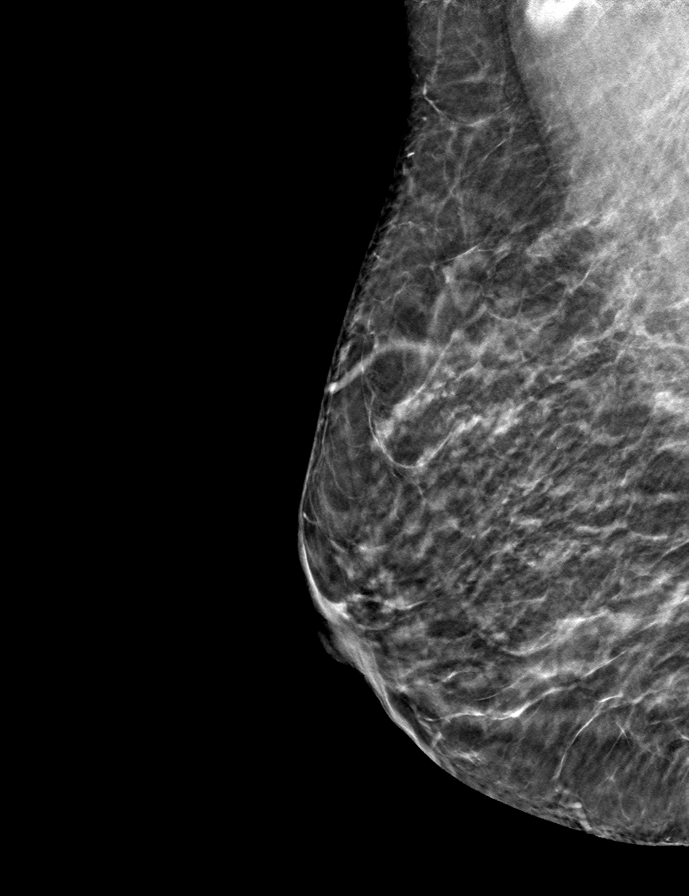

[R CC tomo · tomo slice 31/60.0]
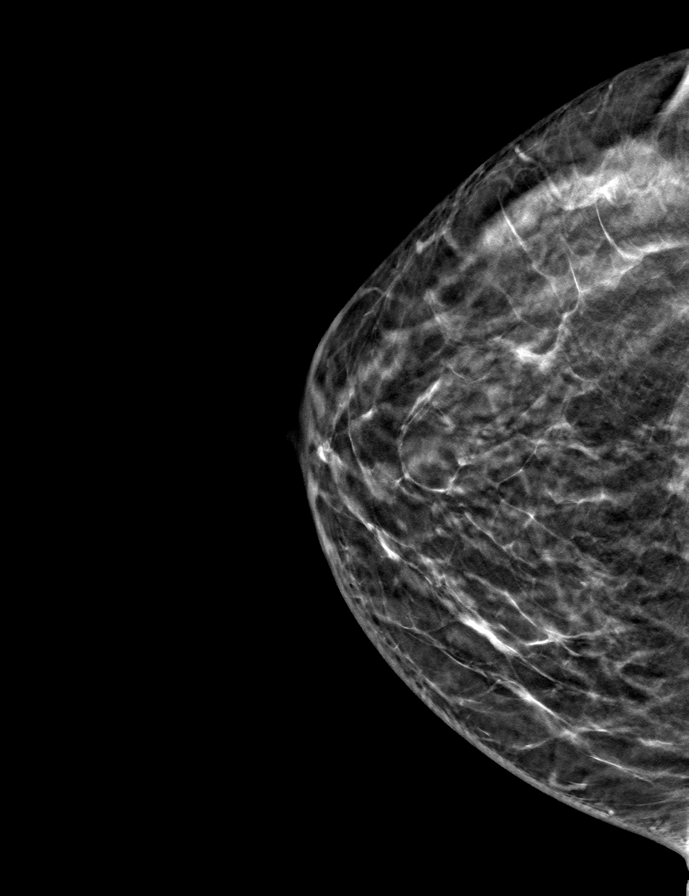

[L CC tomo · tomo slice 29/56.0]
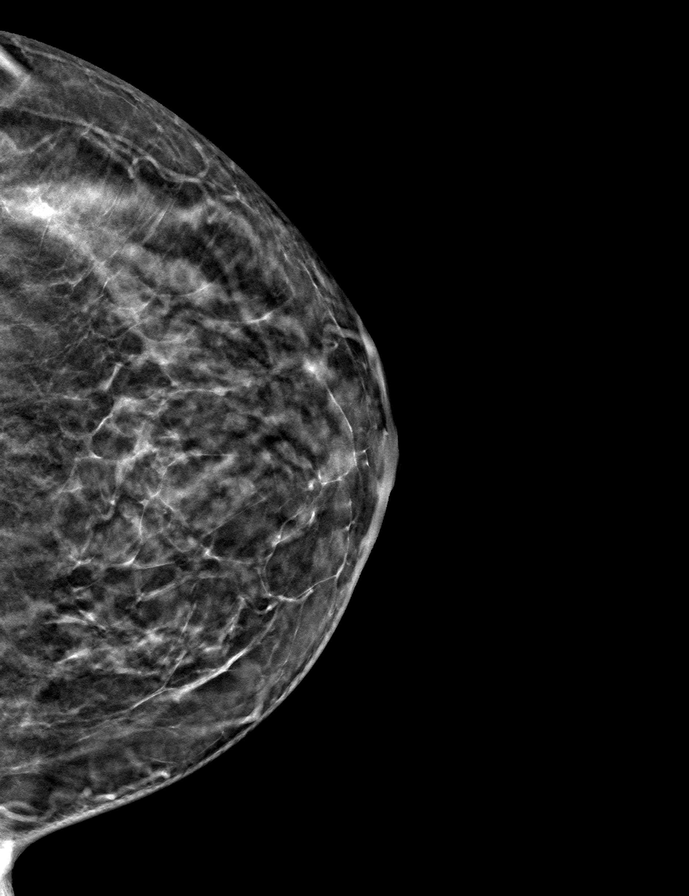

[9 of 24 positions shown; findings below may reference images not displayed]

ACR Breast Density Category c: The breast tissue is heterogeneously
dense, which may obscure small masses
FINDINGS: There are no findings suspicious for malignancy.
IMPRESSION: No mammographic evidence of malignancy. A result letter of this
screening mammogram will be mailed directly to the patient.

RECOMMENDATION:
Screening mammogram in one year. (Code:A7-O-PCM)

BI-RADS CATEGORY  1: Negative.

## 2024-03-25 IMAGING — DX DG CHEST 2V
2 series · 2 of 2 positions shown · non-contrast
Comparison: None Available.

CLINICAL DATA: Cough, wheezing x4 weeks

EXAM:
CHEST - 2 VIEW

[chest pa]
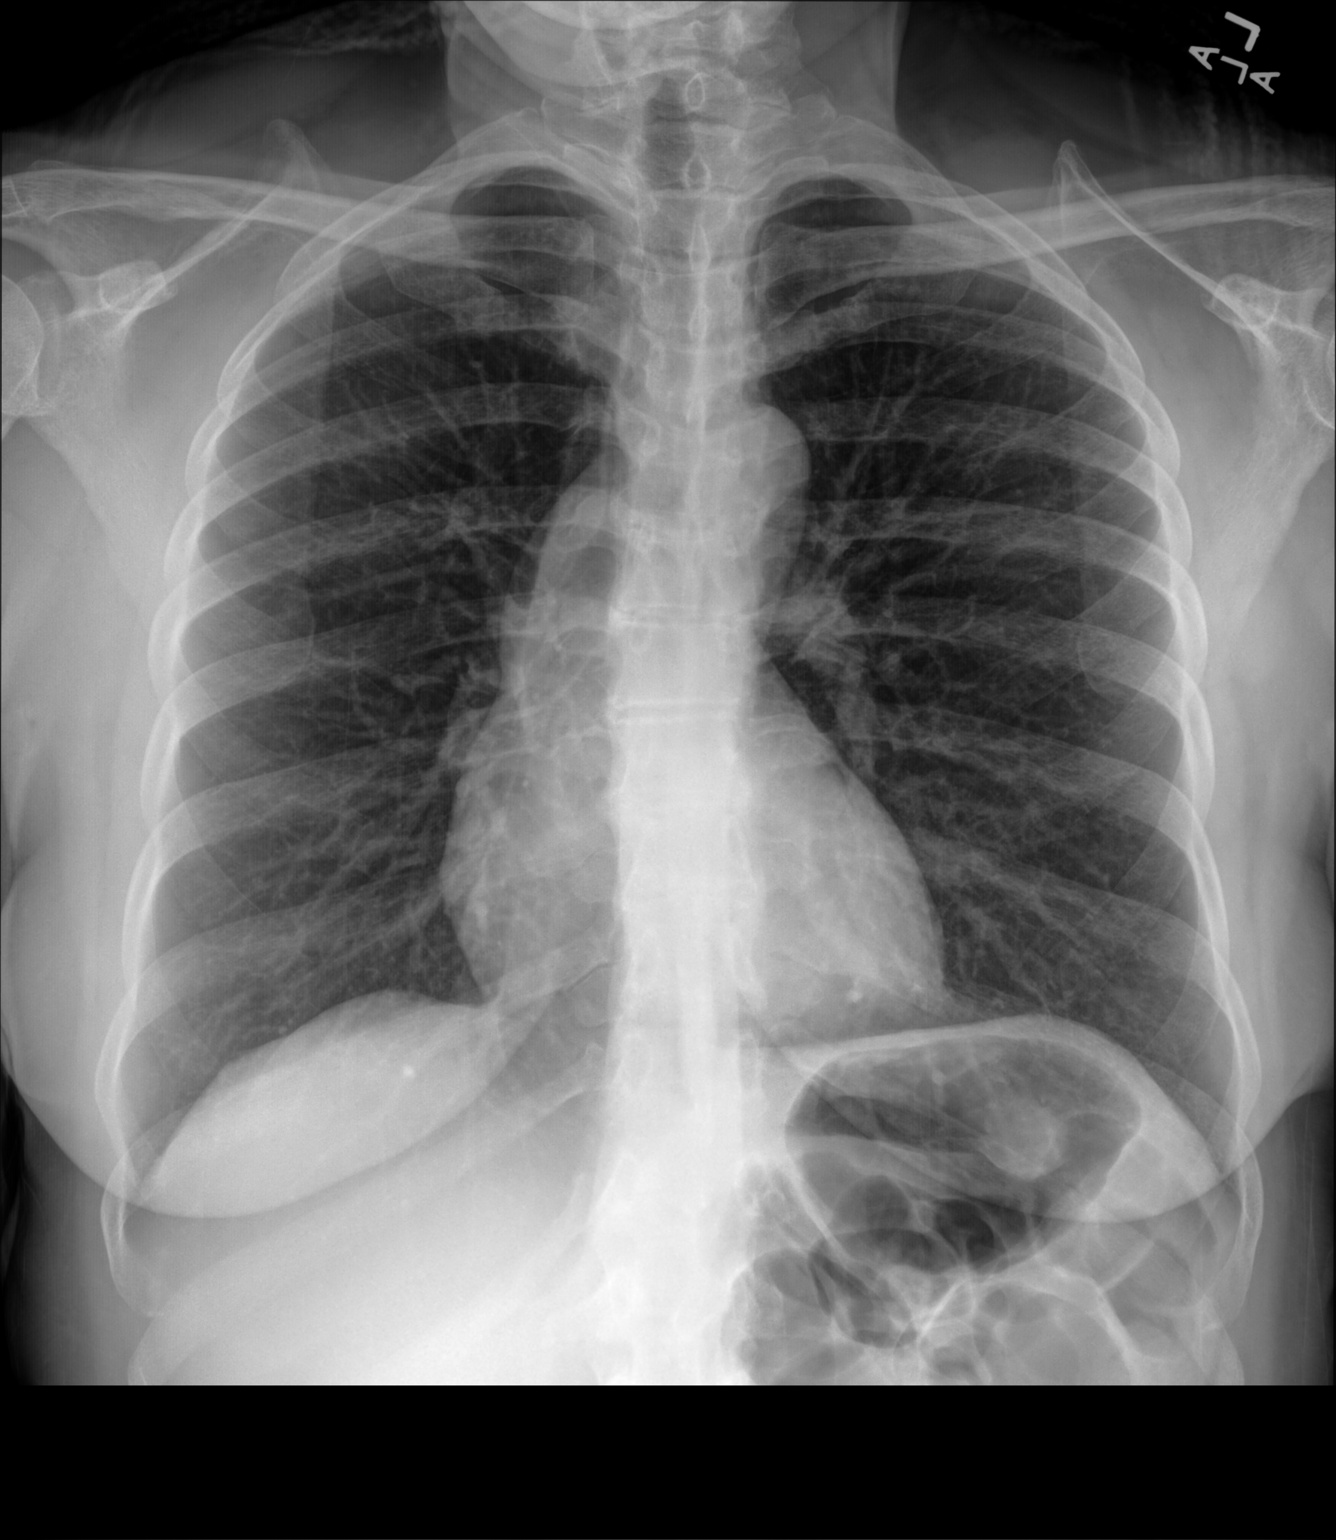

[chest lat]
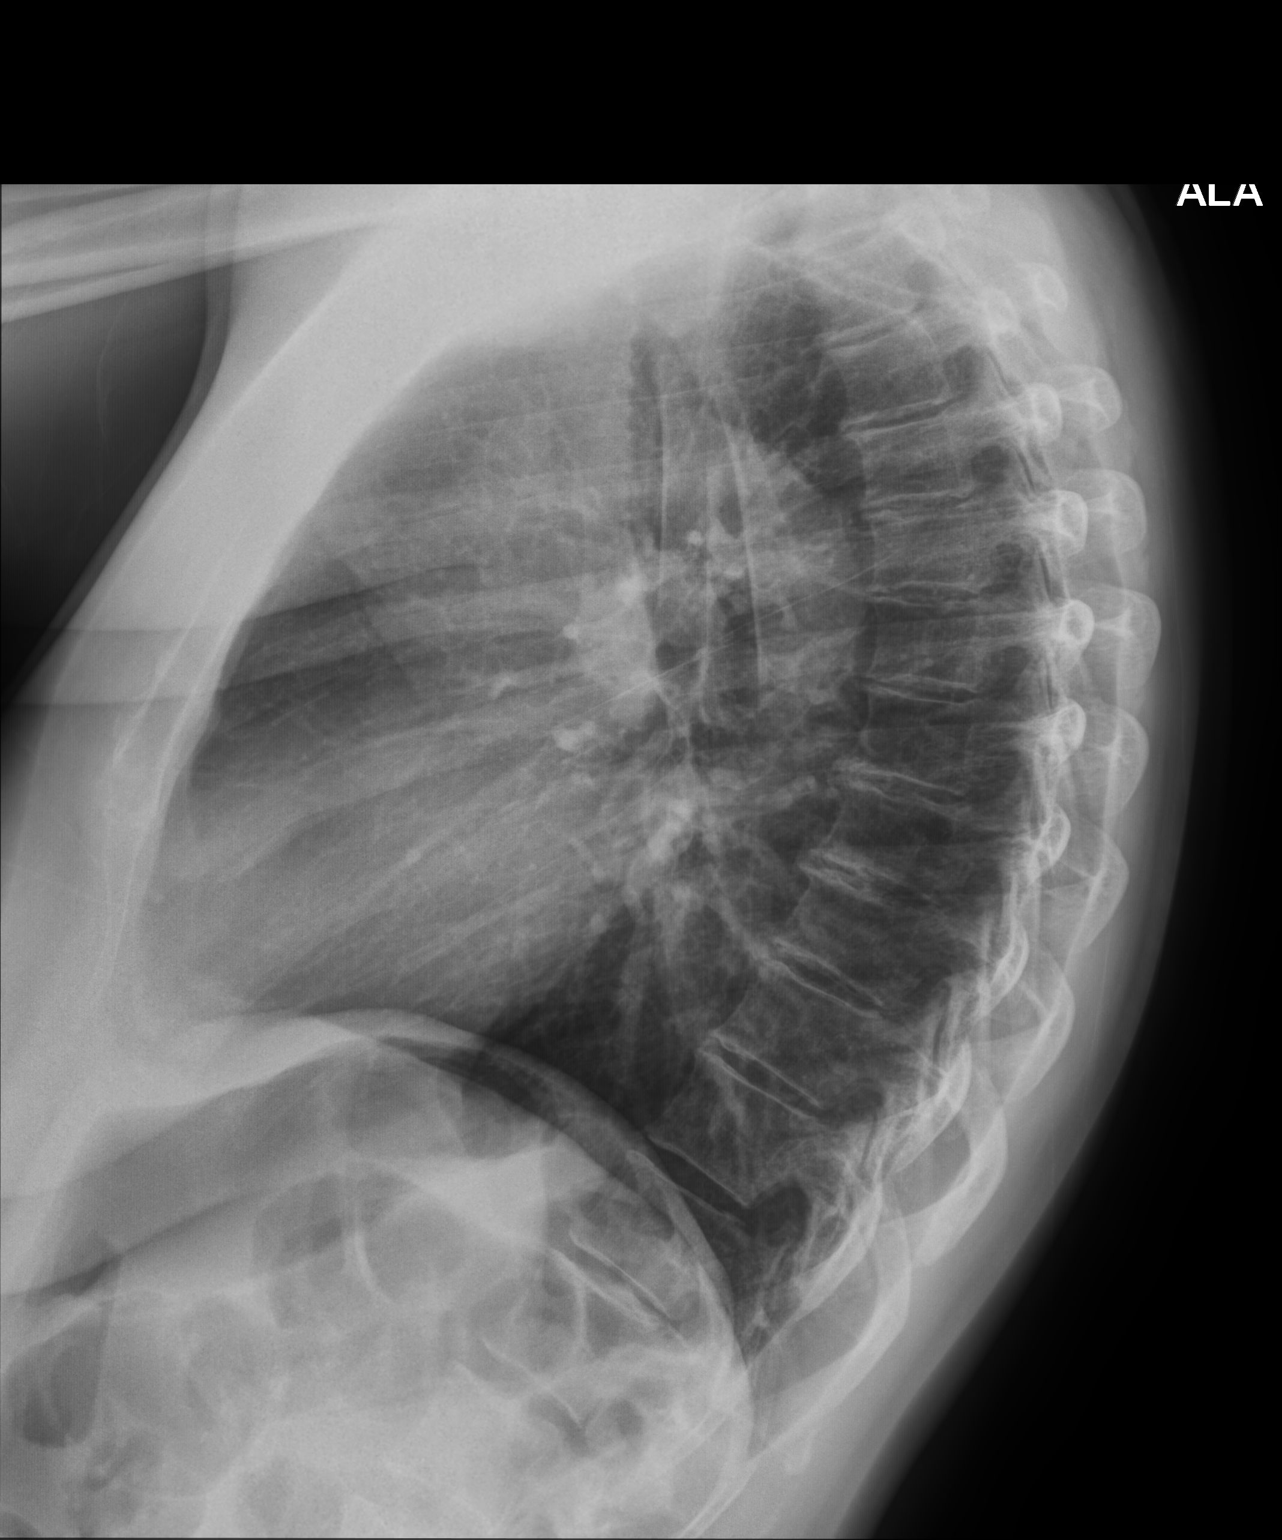

[2 of 2 positions shown; findings below may reference images not displayed]

FINDINGS: The heart size and mediastinal contours are within normal limits.
Both lungs are clear. The visualized skeletal structures are
unremarkable.
IMPRESSION: No active cardiopulmonary disease.

## 2024-04-17 ENCOUNTER — Other Ambulatory Visit: Payer: Self-pay | Admitting: Family Medicine

## 2024-04-17 DIAGNOSIS — M545 Low back pain, unspecified: Secondary | ICD-10-CM

## 2024-04-26 ENCOUNTER — Other Ambulatory Visit: Payer: Self-pay | Admitting: Family Medicine

## 2024-04-26 DIAGNOSIS — I10 Essential (primary) hypertension: Secondary | ICD-10-CM

## 2024-05-04 ENCOUNTER — Other Ambulatory Visit: Payer: Self-pay | Admitting: Family Medicine

## 2024-05-04 DIAGNOSIS — F411 Generalized anxiety disorder: Secondary | ICD-10-CM

## 2024-05-13 ENCOUNTER — Other Ambulatory Visit: Payer: Self-pay | Admitting: Family Medicine

## 2024-05-13 DIAGNOSIS — M545 Low back pain, unspecified: Secondary | ICD-10-CM

## 2024-06-26 ENCOUNTER — Other Ambulatory Visit: Payer: Self-pay | Admitting: Family Medicine

## 2024-06-26 DIAGNOSIS — F411 Generalized anxiety disorder: Secondary | ICD-10-CM

## 2024-06-26 DIAGNOSIS — F5104 Psychophysiologic insomnia: Secondary | ICD-10-CM

## 2024-06-26 NOTE — Telephone Encounter (Signed)
 Patient does not use My Chart, she needs to be seen by the end of January in order to continue refills on her medications. Please call the patient and have her schedule an appointment. Then ok to refill the script until her appointment date. Thanks!

## 2024-07-27 ENCOUNTER — Other Ambulatory Visit: Payer: Self-pay | Admitting: Family Medicine

## 2024-07-27 DIAGNOSIS — I10 Essential (primary) hypertension: Secondary | ICD-10-CM

## 2024-08-03 ENCOUNTER — Other Ambulatory Visit: Payer: Self-pay | Admitting: Family Medicine

## 2024-08-03 DIAGNOSIS — F411 Generalized anxiety disorder: Secondary | ICD-10-CM

## 2024-08-05 ENCOUNTER — Other Ambulatory Visit: Payer: Self-pay | Admitting: Family Medicine

## 2024-08-05 DIAGNOSIS — M545 Low back pain, unspecified: Secondary | ICD-10-CM

## 2024-08-07 NOTE — Telephone Encounter (Signed)
 Pt needs a follow up appt -- has been almost 1 year since she was seen

## 2024-08-14 ENCOUNTER — Other Ambulatory Visit: Payer: Self-pay | Admitting: Family Medicine

## 2024-08-14 DIAGNOSIS — M545 Low back pain, unspecified: Secondary | ICD-10-CM
